# Patient Record
Sex: Female | Born: 1965 | Race: White | Hispanic: No | Marital: Single | Smoking: Current every day smoker
Health system: Southern US, Community
[De-identification: ages and names within clinical notes are randomized; demographics above are authoritative.]

## PROBLEM LIST (undated history)

## (undated) DIAGNOSIS — C50919 Malignant neoplasm of unspecified site of unspecified female breast: Secondary | ICD-10-CM

## (undated) DIAGNOSIS — Z923 Personal history of irradiation: Secondary | ICD-10-CM

## (undated) DIAGNOSIS — W540XXA Bitten by dog, initial encounter: Secondary | ICD-10-CM

## (undated) DIAGNOSIS — S0185XA Open bite of other part of head, initial encounter: Secondary | ICD-10-CM

## (undated) DIAGNOSIS — Z87891 Personal history of nicotine dependence: Secondary | ICD-10-CM

## (undated) DIAGNOSIS — Z9221 Personal history of antineoplastic chemotherapy: Secondary | ICD-10-CM

## (undated) DIAGNOSIS — E669 Obesity, unspecified: Secondary | ICD-10-CM

## (undated) HISTORY — DX: Obesity, unspecified: E66.9

## (undated) HISTORY — DX: Bitten by dog, initial encounter: W54.0XXA

## (undated) HISTORY — DX: Personal history of nicotine dependence: Z87.891

## (undated) HISTORY — DX: Open bite of other part of head, initial encounter: S01.85XA

---

## 2009-08-24 DIAGNOSIS — Z923 Personal history of irradiation: Secondary | ICD-10-CM

## 2009-08-24 DIAGNOSIS — Z9221 Personal history of antineoplastic chemotherapy: Secondary | ICD-10-CM

## 2009-08-24 DIAGNOSIS — C50919 Malignant neoplasm of unspecified site of unspecified female breast: Secondary | ICD-10-CM

## 2009-08-24 HISTORY — PX: PORTACATH PLACEMENT: SHX2246

## 2009-08-24 HISTORY — PX: BREAST LUMPECTOMY: SHX2

## 2009-08-24 HISTORY — DX: Malignant neoplasm of unspecified site of unspecified female breast: C50.919

## 2009-08-24 HISTORY — PX: BREAST SURGERY: SHX581

## 2009-08-24 HISTORY — DX: Personal history of antineoplastic chemotherapy: Z92.21

## 2009-08-24 HISTORY — PX: BREAST EXCISIONAL BIOPSY: SUR124

## 2009-08-24 HISTORY — DX: Personal history of irradiation: Z92.3

## 2009-09-24 ENCOUNTER — Ambulatory Visit: Payer: Self-pay | Admitting: Oncology

## 2009-09-24 ENCOUNTER — Ambulatory Visit: Payer: Self-pay

## 2009-10-14 ENCOUNTER — Ambulatory Visit: Payer: Self-pay | Admitting: Oncology

## 2009-10-17 ENCOUNTER — Ambulatory Visit: Payer: Self-pay | Admitting: Oncology

## 2009-10-18 ENCOUNTER — Ambulatory Visit: Payer: Self-pay | Admitting: General Surgery

## 2009-10-22 ENCOUNTER — Ambulatory Visit: Payer: Self-pay | Admitting: Oncology

## 2009-10-29 ENCOUNTER — Emergency Department: Payer: Self-pay | Admitting: Emergency Medicine

## 2009-11-22 ENCOUNTER — Ambulatory Visit: Payer: Self-pay | Admitting: Oncology

## 2009-12-22 ENCOUNTER — Ambulatory Visit: Payer: Self-pay | Admitting: Oncology

## 2010-01-22 ENCOUNTER — Ambulatory Visit: Payer: Self-pay | Admitting: Oncology

## 2010-02-21 ENCOUNTER — Ambulatory Visit: Payer: Self-pay | Admitting: Oncology

## 2010-03-24 ENCOUNTER — Ambulatory Visit: Payer: Self-pay | Admitting: Oncology

## 2010-04-11 ENCOUNTER — Ambulatory Visit: Payer: Self-pay | Admitting: General Surgery

## 2010-04-16 ENCOUNTER — Ambulatory Visit: Payer: Self-pay | Admitting: General Surgery

## 2010-04-22 LAB — PATHOLOGY REPORT

## 2010-04-24 ENCOUNTER — Ambulatory Visit: Payer: Self-pay | Admitting: Oncology

## 2010-05-12 ENCOUNTER — Ambulatory Visit: Payer: Self-pay | Admitting: General Surgery

## 2010-05-14 LAB — PATHOLOGY REPORT

## 2010-05-24 ENCOUNTER — Ambulatory Visit: Payer: Self-pay | Admitting: Oncology

## 2010-05-29 ENCOUNTER — Ambulatory Visit: Payer: Self-pay | Admitting: Oncology

## 2010-06-24 ENCOUNTER — Ambulatory Visit: Payer: Self-pay | Admitting: Oncology

## 2010-07-24 ENCOUNTER — Ambulatory Visit: Payer: Self-pay | Admitting: Oncology

## 2010-08-24 ENCOUNTER — Ambulatory Visit: Payer: Self-pay | Admitting: Oncology

## 2010-08-27 ENCOUNTER — Ambulatory Visit: Payer: Self-pay

## 2010-09-24 ENCOUNTER — Ambulatory Visit: Payer: Self-pay | Admitting: Oncology

## 2010-10-30 ENCOUNTER — Ambulatory Visit: Payer: Self-pay | Admitting: Oncology

## 2010-11-23 ENCOUNTER — Ambulatory Visit: Payer: Self-pay | Admitting: Oncology

## 2011-01-30 ENCOUNTER — Ambulatory Visit: Payer: Self-pay | Admitting: Oncology

## 2011-01-31 LAB — CANCER ANTIGEN 27.29: CA 27.29: 24.4 U/mL (ref 0.0–38.6)

## 2011-02-22 ENCOUNTER — Ambulatory Visit: Payer: Self-pay | Admitting: Oncology

## 2011-03-24 ENCOUNTER — Ambulatory Visit: Payer: Self-pay

## 2011-04-21 ENCOUNTER — Ambulatory Visit: Payer: Self-pay | Admitting: Oncology

## 2011-05-12 ENCOUNTER — Ambulatory Visit: Payer: Self-pay | Admitting: Oncology

## 2011-05-25 ENCOUNTER — Ambulatory Visit: Payer: Self-pay | Admitting: Oncology

## 2011-06-03 ENCOUNTER — Ambulatory Visit: Payer: Self-pay | Admitting: Gynecologic Oncology

## 2011-06-09 ENCOUNTER — Ambulatory Visit: Payer: Self-pay | Admitting: Gynecologic Oncology

## 2011-06-25 ENCOUNTER — Ambulatory Visit: Payer: Self-pay | Admitting: Oncology

## 2011-08-11 ENCOUNTER — Ambulatory Visit: Payer: Self-pay | Admitting: Oncology

## 2011-08-25 ENCOUNTER — Ambulatory Visit: Payer: Self-pay | Admitting: Oncology

## 2011-08-25 DIAGNOSIS — E669 Obesity, unspecified: Secondary | ICD-10-CM

## 2011-08-25 DIAGNOSIS — Z87891 Personal history of nicotine dependence: Secondary | ICD-10-CM

## 2011-08-25 HISTORY — DX: Personal history of nicotine dependence: Z87.891

## 2011-08-25 HISTORY — DX: Obesity, unspecified: E66.9

## 2011-10-01 IMAGING — US ULTRASOUND LEFT BREAST
1 series · 17 of 17 positions shown · non-contrast
Comparison: none

REASON FOR EXAM: 1-2 cm thickening at [DATE] left breast 4 cm from the
[place]
COMMENTS:

[Series 1: ultrasound left breast · 17 of 17 slices shown]
[im 1/17]
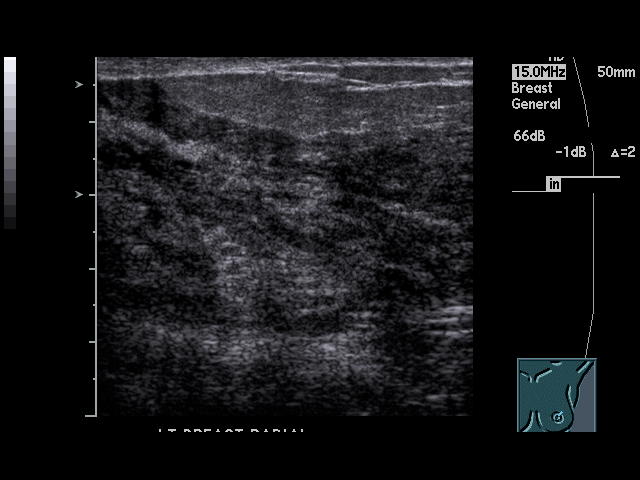
[im 2/17]
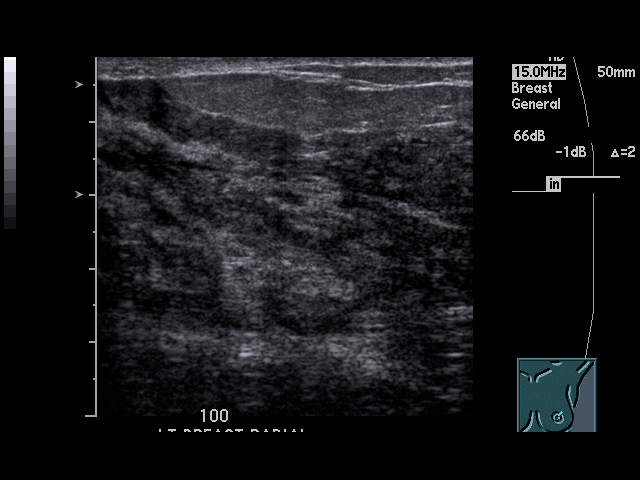
[im 3/17]
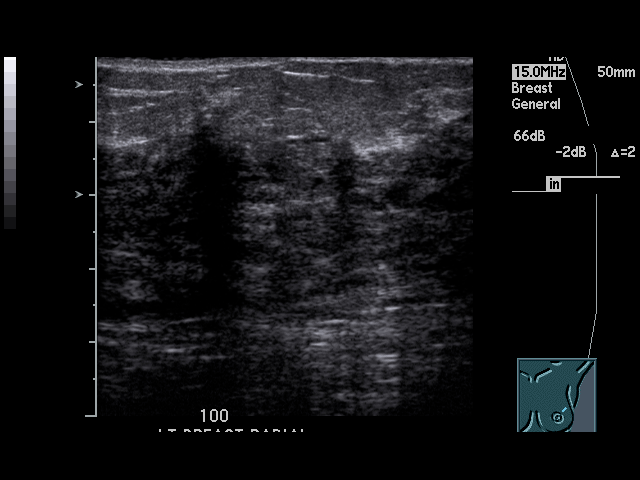
[im 4/17]
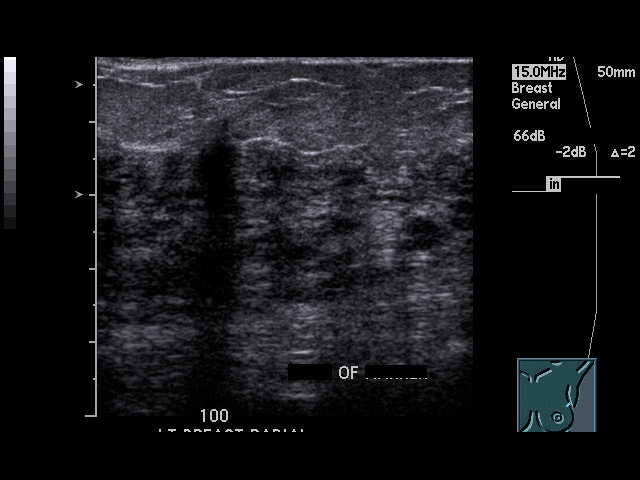
[im 5/17]
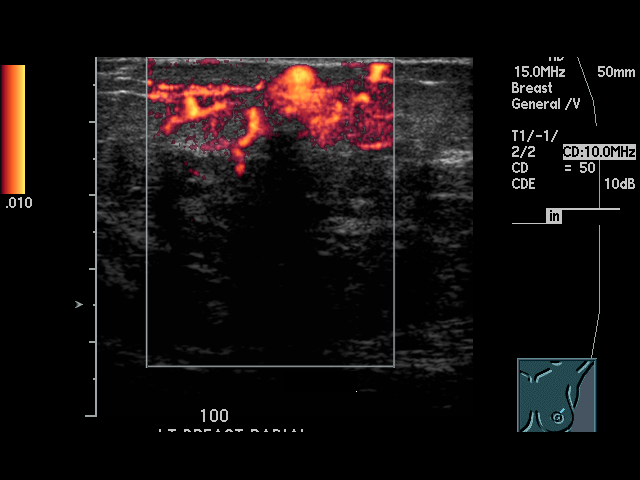
[im 6/17]
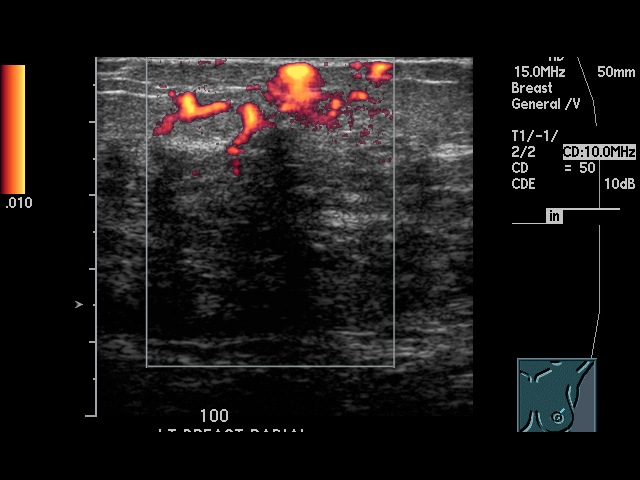
[im 7/17]
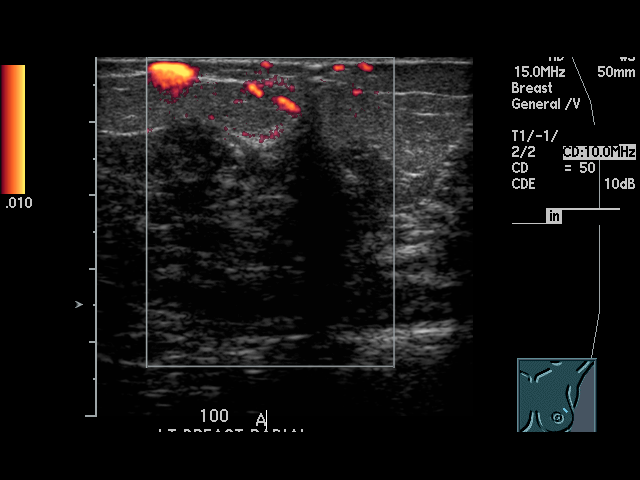
[im 8/17]
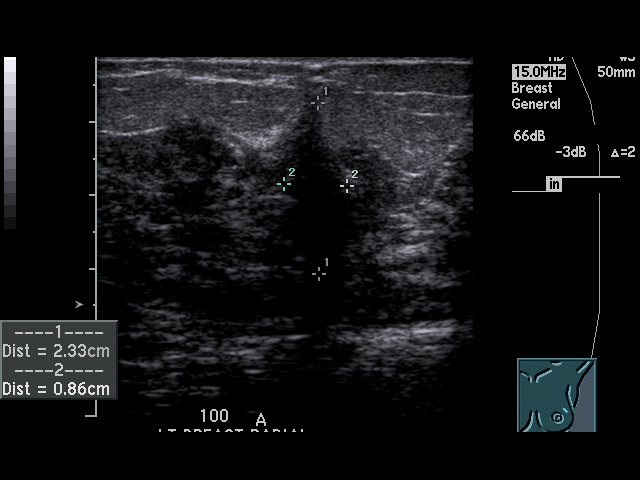
[im 9/17]
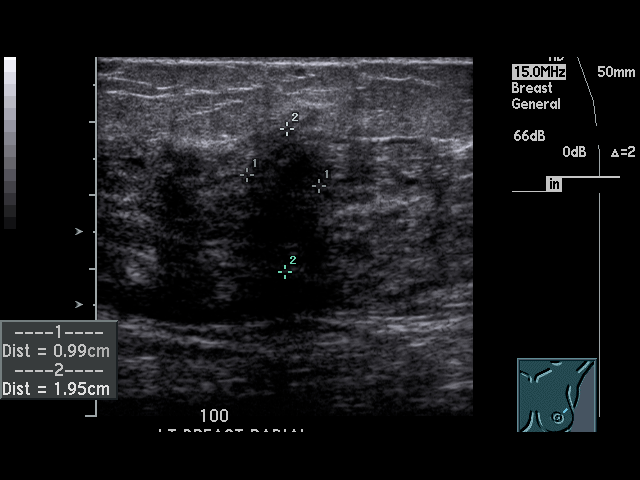
[im 10/17]
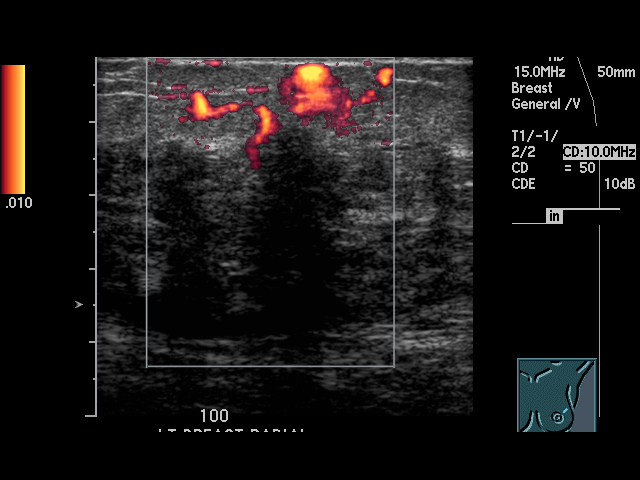
[im 11/17]
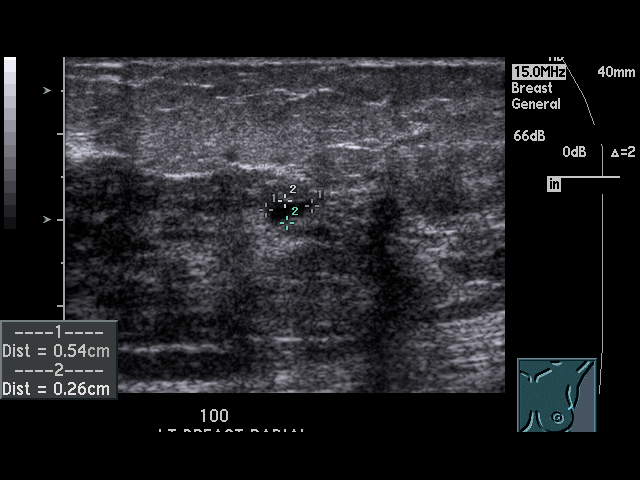
[im 12/17]
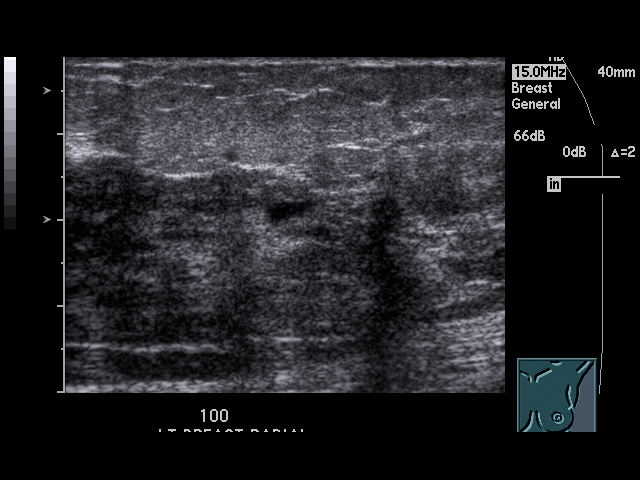
[im 13/17]
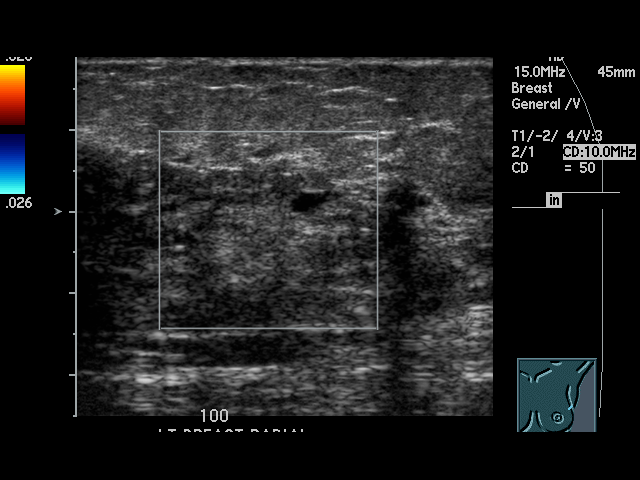
[im 14/17]
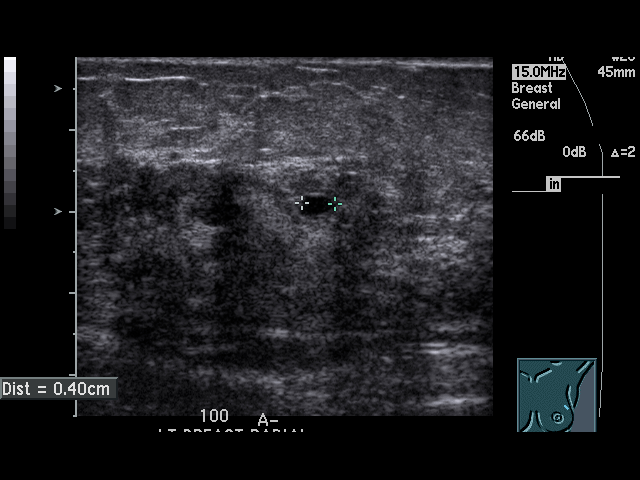
[im 15/17]
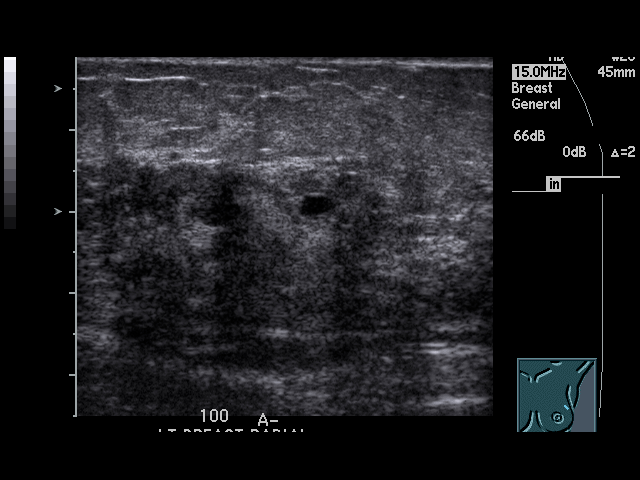
[im 16/17]
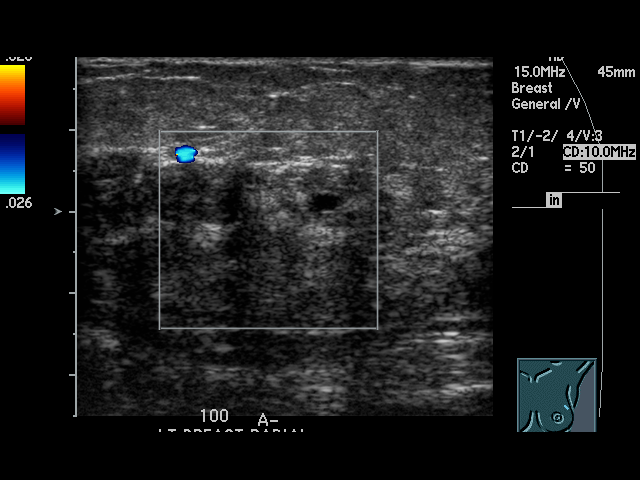
[im 17/17]
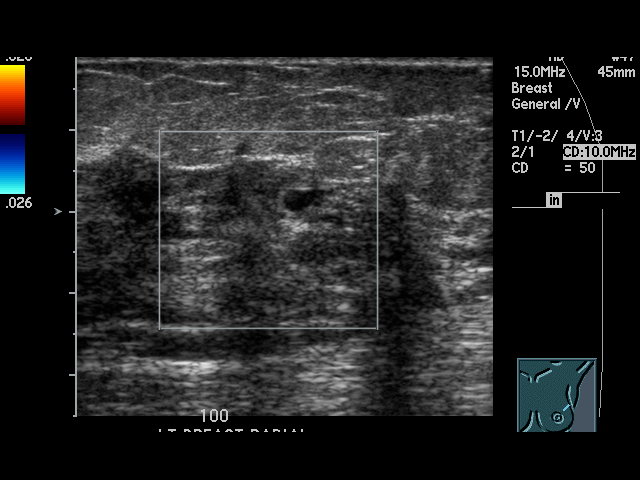

[17 of 17 positions shown; findings below may reference images not displayed]

PROCEDURE:     US  - US LT BREAST ([REDACTED])  - September 24, 2009 [DATE]

RESULT:       A large 2.3 cm hypoechoic irregular lesion with shadowing is
noted in the region of the patient's palpable abnormality.  Mammographic
abnormality in this region reveals pathologic-appearing microcalcifications
and a soft tissue density.  The findings are suspicious for malignancy and
surgical evaluation is suggested.  Small cyst is also incidentally noted in
the left breast.  This measures 5 mm.
IMPRESSION: 1.     Very suspicious exam for malignancy. Surgical consultation is
suggested.
2.     BI-RADS:  Category 4- Suspicious Abnormality.

## 2011-11-10 ENCOUNTER — Ambulatory Visit: Payer: Self-pay | Admitting: Oncology

## 2011-11-23 ENCOUNTER — Ambulatory Visit: Payer: Self-pay | Admitting: Oncology

## 2011-12-10 LAB — COMPREHENSIVE METABOLIC PANEL
Alkaline Phosphatase: 60 U/L (ref 50–136)
Anion Gap: 9 (ref 7–16)
BUN: 8 mg/dL (ref 7–18)
Bilirubin,Total: 0.7 mg/dL (ref 0.2–1.0)
Calcium, Total: 8.7 mg/dL (ref 8.5–10.1)
Chloride: 105 mmol/L (ref 98–107)
Co2: 27 mmol/L (ref 21–32)
Creatinine: 0.78 mg/dL (ref 0.60–1.30)
EGFR (African American): >60
EGFR (Non-African Amer.): >60
Glucose: 131 mg/dL — ABNORMAL HIGH (ref 65–99)
Osmolality: 281 (ref 275–301)
Potassium: 3.6 mmol/L (ref 3.5–5.1)
SGOT(AST): 25 U/L (ref 15–37)
SGPT (ALT): 34 U/L
Total Protein: 6.7 g/dL (ref 6.4–8.2)

## 2011-12-10 LAB — CBC CANCER CENTER
Eosinophil #: 0.1 x10 3/mm (ref 0.0–0.7)
Eosinophil %: 2.5 %
HCT: 35.1 % (ref 35.0–47.0)
HGB: 12.3 g/dL (ref 12.0–16.0)
Lymphocyte #: 1 x10 3/mm (ref 1.0–3.6)
Lymphocyte %: 23.8 %
MCHC: 35 g/dL (ref 32.0–36.0)
MCV: 97 fL (ref 80–100)
Monocyte #: 0.2 x10 3/mm (ref 0.2–0.9)
Monocyte %: 5.2 %
Platelet: 211 x10 3/mm (ref 150–440)
WBC: 4.3 x10 3/mm (ref 3.6–11.0)

## 2011-12-23 ENCOUNTER — Ambulatory Visit: Payer: Self-pay | Admitting: Oncology

## 2012-01-07 LAB — COMPREHENSIVE METABOLIC PANEL
Albumin: 3.8 g/dL (ref 3.4–5.0)
BUN: 6 mg/dL — ABNORMAL LOW (ref 7–18)
Bilirubin,Total: 0.8 mg/dL (ref 0.2–1.0)
Chloride: 105 mmol/L (ref 98–107)
Co2: 26 mmol/L (ref 21–32)
Creatinine: 0.63 mg/dL (ref 0.60–1.30)
EGFR (African American): >60
EGFR (Non-African Amer.): >60
Glucose: 87 mg/dL (ref 65–99)
SGOT(AST): 27 U/L (ref 15–37)
Sodium: 141 mmol/L (ref 136–145)
Total Protein: 6.9 g/dL (ref 6.4–8.2)

## 2012-01-07 LAB — CBC CANCER CENTER
Basophil #: 0 x10 3/mm (ref 0.0–0.1)
Basophil %: 0.6 %
Eosinophil #: 0.1 x10 3/mm (ref 0.0–0.7)
Eosinophil %: 2.4 %
HCT: 37.2 % (ref 35.0–47.0)
Lymphocyte #: 1.2 x10 3/mm (ref 1.0–3.6)
MCH: 33.3 pg (ref 26.0–34.0)
MCHC: 34.5 g/dL (ref 32.0–36.0)
MCV: 96 fL (ref 80–100)
Monocyte #: 0.2 x10 3/mm (ref 0.2–0.9)
Neutrophil #: 3.4 x10 3/mm (ref 1.4–6.5)
RBC: 3.86 10*6/uL (ref 3.80–5.20)

## 2012-01-23 ENCOUNTER — Ambulatory Visit: Payer: Self-pay | Admitting: Oncology

## 2012-03-15 ENCOUNTER — Ambulatory Visit: Payer: Self-pay | Admitting: Oncology

## 2012-03-24 ENCOUNTER — Ambulatory Visit: Payer: Self-pay | Admitting: Oncology

## 2012-04-07 LAB — CBC CANCER CENTER
Basophil #: 0 x10 3/mm (ref 0.0–0.1)
Basophil %: 0.9 %
Eosinophil %: 2.5 %
Lymphocyte #: 1.1 x10 3/mm (ref 1.0–3.6)
MCH: 33.8 pg (ref 26.0–34.0)
MCHC: 34.7 g/dL (ref 32.0–36.0)
Monocyte %: 5.7 %
Platelet: 178 x10 3/mm (ref 150–440)
RBC: 3.72 10*6/uL — ABNORMAL LOW (ref 3.80–5.20)
RDW: 12.9 % (ref 11.5–14.5)
WBC: 4.2 x10 3/mm (ref 3.6–11.0)

## 2012-04-07 LAB — COMPREHENSIVE METABOLIC PANEL
Anion Gap: 7 (ref 7–16)
BUN: 8 mg/dL (ref 7–18)
Bilirubin,Total: 0.7 mg/dL (ref 0.2–1.0)
Calcium, Total: 8.6 mg/dL (ref 8.5–10.1)
Chloride: 105 mmol/L (ref 98–107)
Creatinine: 0.77 mg/dL (ref 0.60–1.30)
EGFR (African American): >60
Osmolality: 276 (ref 275–301)
Potassium: 3.9 mmol/L (ref 3.5–5.1)
Total Protein: 7 g/dL (ref 6.4–8.2)

## 2012-04-08 LAB — CANCER ANTIGEN 27.29: CA 27.29: 18.2 U/mL (ref 0.0–38.6)

## 2012-04-12 ENCOUNTER — Ambulatory Visit: Payer: Self-pay | Admitting: Gynecologic Oncology

## 2012-04-12 LAB — CBC
HGB: 13.3 g/dL (ref 12.0–16.0)
MCH: 34.3 pg — ABNORMAL HIGH (ref 26.0–34.0)
MCHC: 35.3 g/dL (ref 32.0–36.0)
Platelet: 208 10*3/uL (ref 150–440)
RDW: 13 % (ref 11.5–14.5)
WBC: 5.3 10*3/uL (ref 3.6–11.0)

## 2012-04-12 LAB — BASIC METABOLIC PANEL
BUN: 8 mg/dL (ref 7–18)
Calcium, Total: 9 mg/dL (ref 8.5–10.1)
Creatinine: 0.68 mg/dL (ref 0.60–1.30)
EGFR (African American): >60
EGFR (Non-African Amer.): >60
Glucose: 76 mg/dL (ref 65–99)
Potassium: 3.8 mmol/L (ref 3.5–5.1)
Sodium: 138 mmol/L (ref 136–145)

## 2012-04-19 ENCOUNTER — Ambulatory Visit: Payer: Self-pay | Admitting: Gynecologic Oncology

## 2012-04-24 ENCOUNTER — Ambulatory Visit: Payer: Self-pay | Admitting: Oncology

## 2012-05-24 ENCOUNTER — Ambulatory Visit: Payer: Self-pay | Admitting: Oncology

## 2012-06-05 IMAGING — CT CT SIM MISC
1 series · 16 of 33 positions shown, 20 images · non-contrast
Comparison: none

[Series 2: — · axial · 1.17mm/px · z∈[-855,-576]mm · 16 of 101 slices shown, 20 images]
[im 4/101  mediastinal]
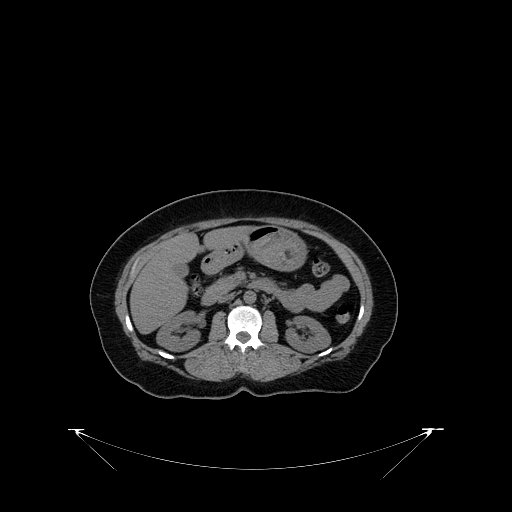
[im 4/101  lung]
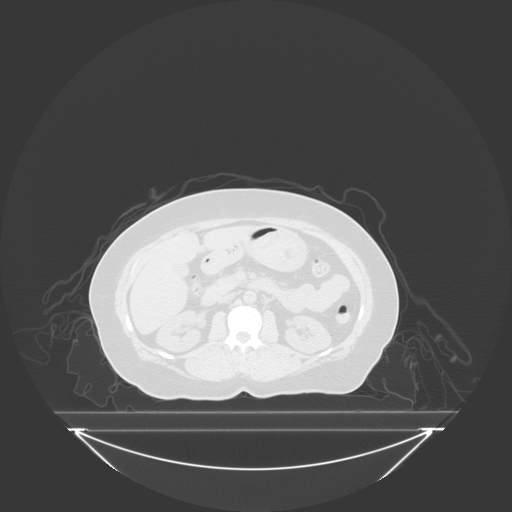
[im 12/101  lung]
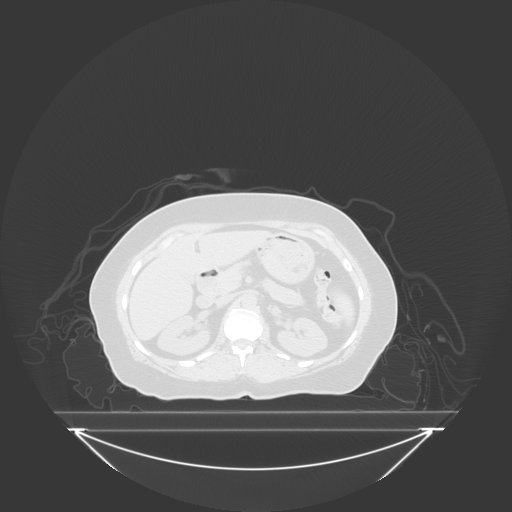
[im 19/101  lung]
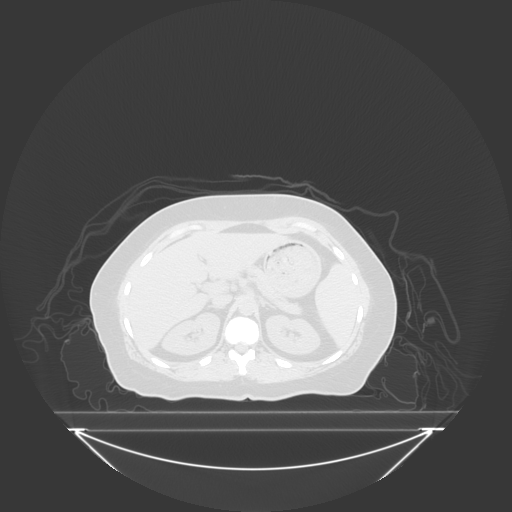
[im 23/101  lung]
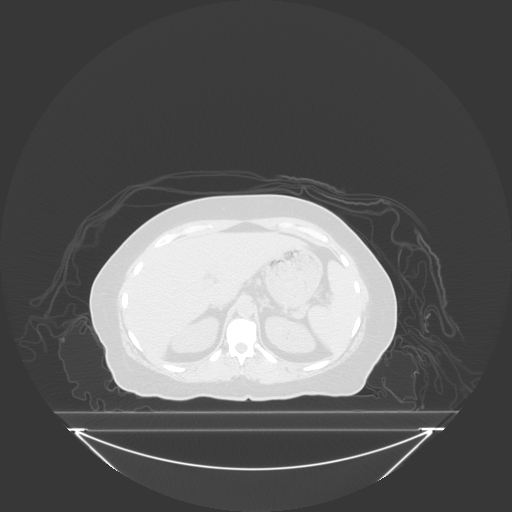
[im 30/101  mediastinal]
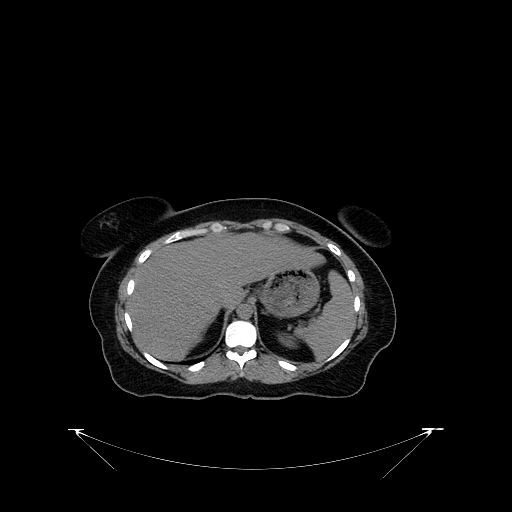
[im 30/101  lung]
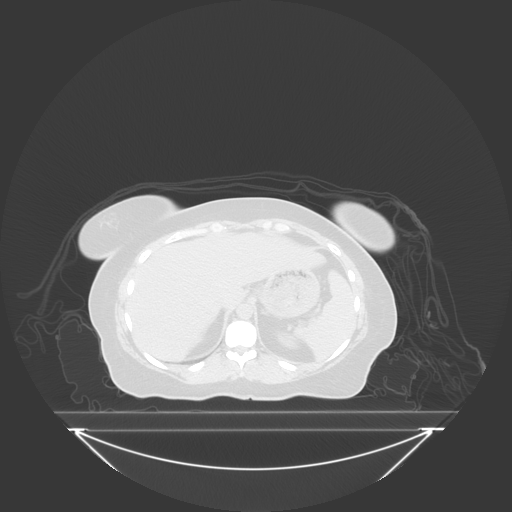
[im 38/101  lung]
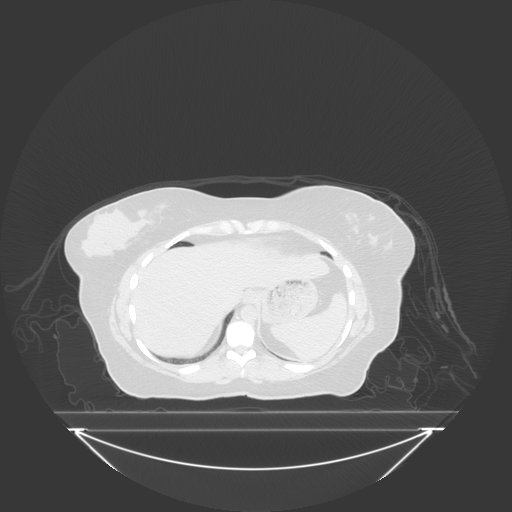
[im 45/101  lung]
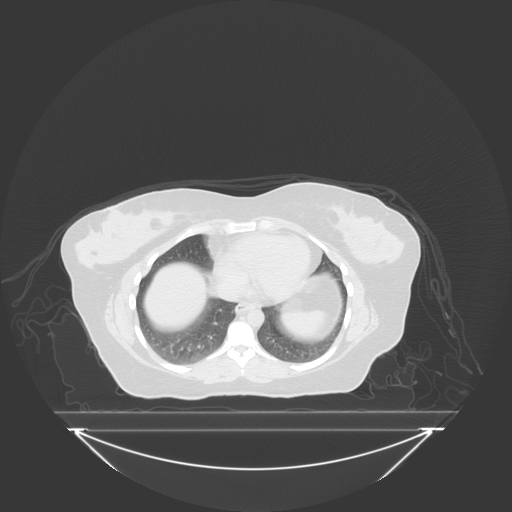
[im 49/101  lung]
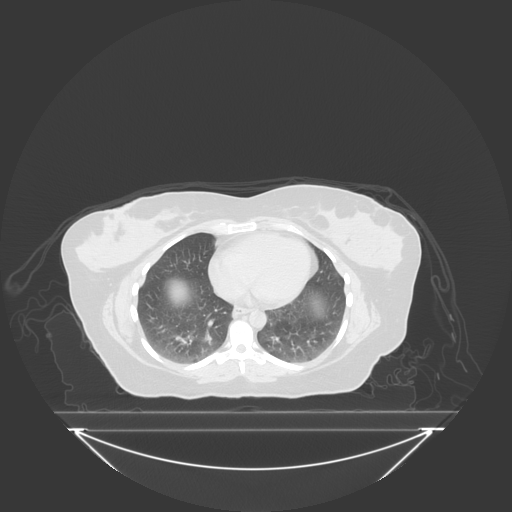
[im 54/101  mediastinal]
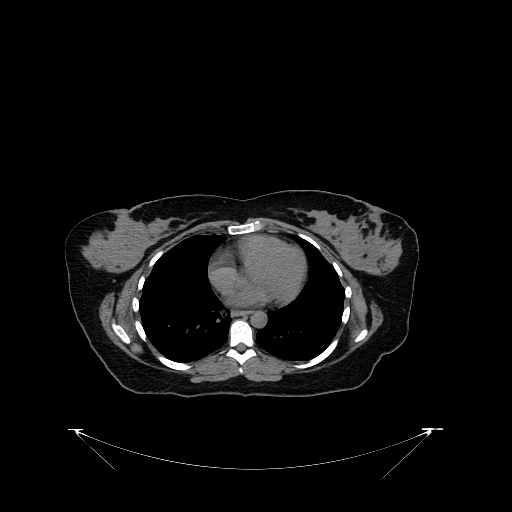
[im 54/101  lung]
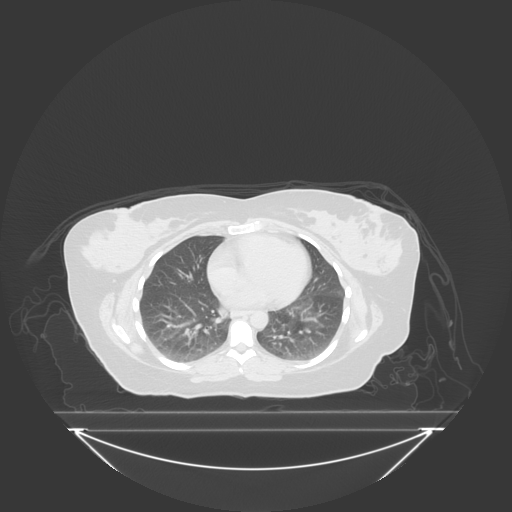
[im 60/101  lung]
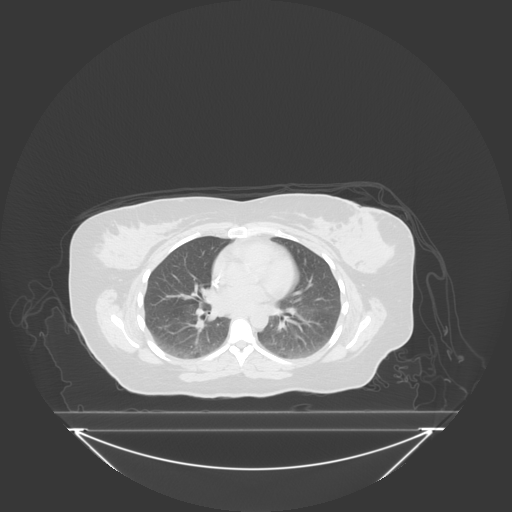
[im 63/101  lung]
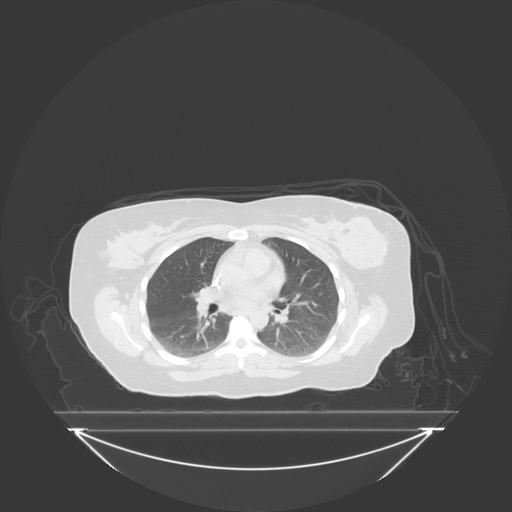
[im 71/101  lung]
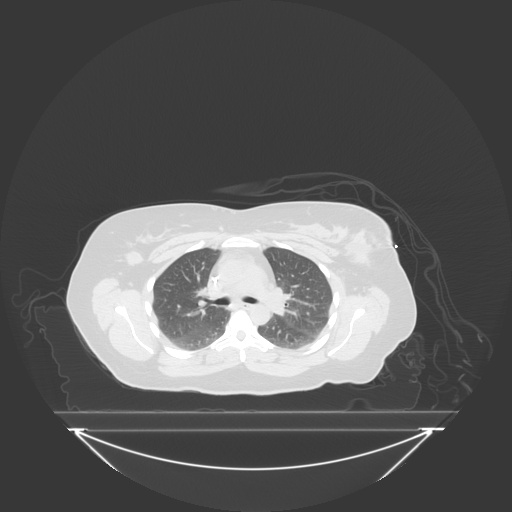
[im 78/101  mediastinal]
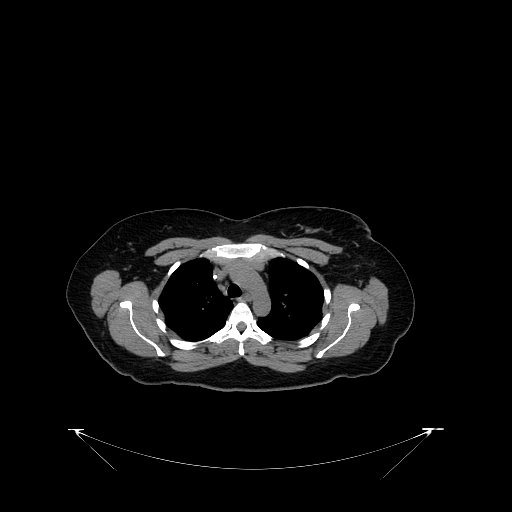
[im 78/101  lung]
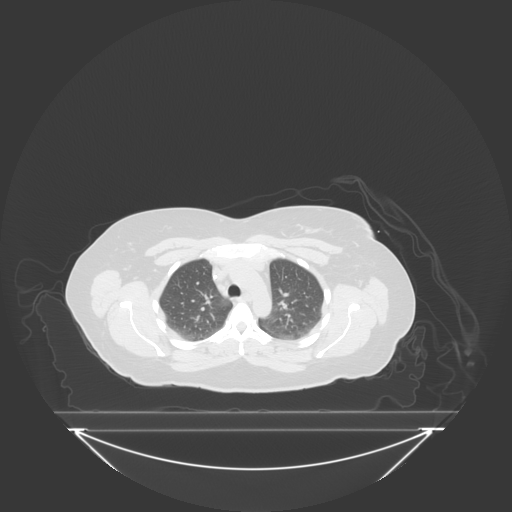
[im 82/101  lung]
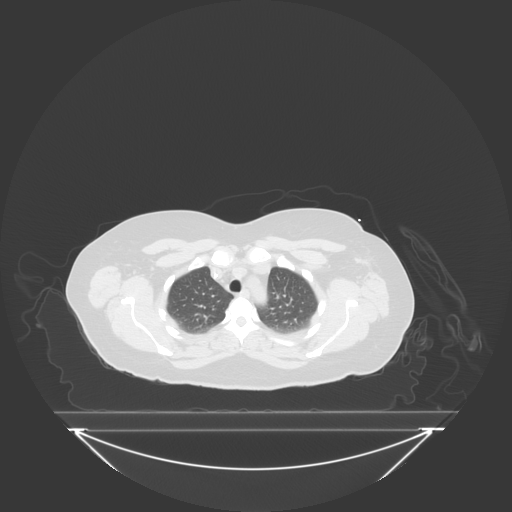
[im 89/101  lung]
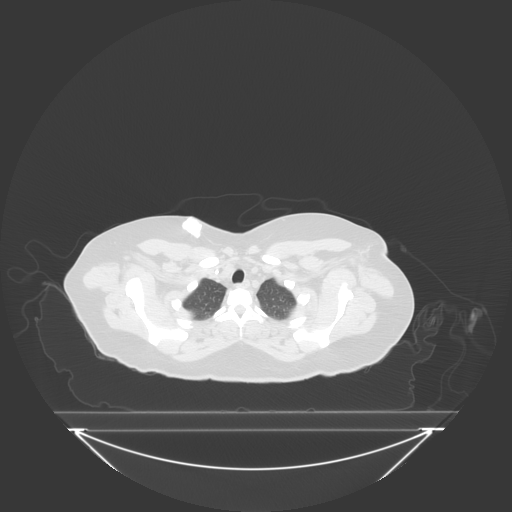
[im 97/101  lung]
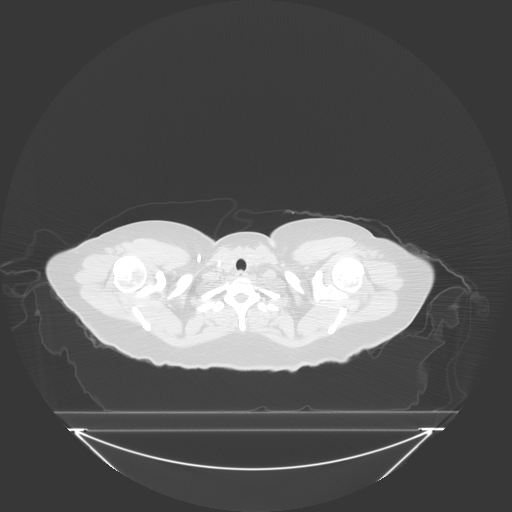

[16 of 33 positions shown; findings below may reference images not displayed]

IMAGES IMPORTED FROM THE [employer] WORKFLOW SYSTEM
NO DICTATION FOR STUDY

## 2012-08-30 ENCOUNTER — Ambulatory Visit: Payer: Self-pay | Admitting: Oncology

## 2012-09-24 ENCOUNTER — Ambulatory Visit: Payer: Self-pay | Admitting: Oncology

## 2012-10-22 ENCOUNTER — Ambulatory Visit: Payer: Self-pay | Admitting: Oncology

## 2012-11-10 LAB — CBC CANCER CENTER
Basophil %: 1.1 %
Eosinophil #: 0.1 x10 3/mm (ref 0.0–0.7)
Eosinophil %: 2.2 %
HCT: 36.9 % (ref 35.0–47.0)
HGB: 13.2 g/dL (ref 12.0–16.0)
MCH: 33.8 pg (ref 26.0–34.0)
MCHC: 35.8 g/dL (ref 32.0–36.0)
MCV: 94 fL (ref 80–100)
Monocyte %: 5.2 %
Neutrophil %: 63.7 %
Platelet: 209 x10 3/mm (ref 150–440)
RBC: 3.91 10*6/uL (ref 3.80–5.20)
WBC: 5.2 x10 3/mm (ref 3.6–11.0)

## 2012-11-10 LAB — COMPREHENSIVE METABOLIC PANEL
Albumin: 3.6 g/dL (ref 3.4–5.0)
Anion Gap: 4 — ABNORMAL LOW (ref 7–16)
Bilirubin,Total: 0.8 mg/dL (ref 0.2–1.0)
Calcium, Total: 8.6 mg/dL (ref 8.5–10.1)
Co2: 27 mmol/L (ref 21–32)
Creatinine: 0.65 mg/dL (ref 0.60–1.30)
EGFR (African American): >60
EGFR (Non-African Amer.): >60
Osmolality: 269 (ref 275–301)
SGOT(AST): 55 U/L — ABNORMAL HIGH (ref 15–37)
SGPT (ALT): 56 U/L (ref 12–78)
Total Protein: 7.6 g/dL (ref 6.4–8.2)

## 2012-11-11 LAB — CANCER ANTIGEN 27.29: CA 27.29: 28.2 U/mL (ref 0.0–38.6)

## 2012-11-22 ENCOUNTER — Ambulatory Visit: Payer: Self-pay | Admitting: Oncology

## 2013-01-18 ENCOUNTER — Encounter: Payer: Self-pay | Admitting: *Deleted

## 2013-01-18 DIAGNOSIS — C50412 Malignant neoplasm of upper-outer quadrant of left female breast: Secondary | ICD-10-CM | POA: Insufficient documentation

## 2013-02-23 ENCOUNTER — Ambulatory Visit: Payer: Self-pay | Admitting: Gynecologic Oncology

## 2013-02-27 ENCOUNTER — Ambulatory Visit: Payer: Self-pay | Admitting: Oncology

## 2013-03-24 ENCOUNTER — Ambulatory Visit: Payer: Self-pay | Admitting: Oncology

## 2013-03-29 ENCOUNTER — Ambulatory Visit: Payer: Self-pay

## 2013-05-02 ENCOUNTER — Encounter: Payer: Self-pay | Admitting: General Surgery

## 2013-05-02 ENCOUNTER — Ambulatory Visit (INDEPENDENT_AMBULATORY_CARE_PROVIDER_SITE_OTHER): Payer: PRIVATE HEALTH INSURANCE | Admitting: General Surgery

## 2013-05-02 VITALS — BP 132/98 | HR 70 | Resp 12 | Ht 62.0 in | Wt 163.0 lb

## 2013-05-02 DIAGNOSIS — Z853 Personal history of malignant neoplasm of breast: Secondary | ICD-10-CM

## 2013-05-02 NOTE — Progress Notes (Signed)
Patient ID: Sonya Perry, female   DOB: November 21, 1965, 47 y.o.   MRN: [id number]  Chief Complaint  Patient presents with  . Follow-up    mammogram    HPI Sonya Perry is a 47 y.o. female who presents for a breast cancer follow up evaluation. The most recent mammogram was done on 03/29/13.  Patient does perform regular self breast checks and gets regular mammograms done.    HPI  Past Medical History  Diagnosis Date  . Cancer 2011    left breast  . Personal history of tobacco use, presenting hazards to health 2013  . Obesity, unspecified 2013  . Malignant neoplasm of upper-outer quadrant of female breast 2011    left breast    Past Surgical History  Procedure Laterality Date  . Cesarean section  1989  . Portacath placement  2011  . Breast surgery Left 2011    lumpectomy radiation and chemo.    History reviewed. No pertinent family history.  Social History History  Substance Use Topics  . Smoking status: Former Smoker -- 2 years    Types: Cigarettes  . Smokeless tobacco: Never Used     Comment: 1-2 cigarettes a day  . Alcohol Use: No    Allergies  Allergen Reactions  . Aleve [Naproxen Sodium] Other (See Comments)    Stomach pain/gas pains/upset stomach    Current Outpatient Prescriptions  Medication Sig Dispense Refill  . tamoxifen (NOLVADEX) 20 MG tablet Take 20 mg by mouth daily.       No current facility-administered medications for this visit.    Review of Systems Review of Systems  Constitutional: Negative.   Respiratory: Negative.   Cardiovascular: Negative.     Blood pressure 132/98, pulse 70, resp. rate 12, height 5\' 2"  (1.575 m), weight 163 lb (73.936 kg).  Physical Exam Physical Exam  Constitutional: She is oriented to person, place, and time. She appears well-developed and well-nourished.  Eyes: Conjunctivae are normal. No scleral icterus.  Neck: Neck supple. No tracheal deviation present. No mass and no thyromegaly present.   Cardiovascular: Normal rate, regular rhythm, normal heart sounds, intact distal pulses and normal pulses.   Pulses:      Dorsalis pedis pulses are 2+ on the right side, and 2+ on the left side.       Posterior tibial pulses are 2+ on the right side, and 2+ on the left side.  No edema  Or calf tenderness.  Pulmonary/Chest: Breath sounds normal. Right breast exhibits no inverted nipple, no mass, no nipple discharge, no skin change and no tenderness. Left breast exhibits no inverted nipple, no mass, no nipple discharge, no skin change and no tenderness.  Left lumpectomy site well healed.  Lymphadenopathy:    She has no cervical adenopathy.    She has no axillary adenopathy.  Neurological: She is alert and oriented to person, place, and time.  Skin: Skin is warm and dry.    Data Reviewed Mammogram reviewed -BIRADS 2  Assessment   Stable exam  Patient is three years post  left breast cancer (T1,N0,M0, ER/PR pos, Her2 neg)  treatment, now  on tamoxifen.  Plan    Patient to return in one year with bilateral diagnotic mammogram.      Juneau Doughman G 05/02/2013, 9:17 AM

## 2013-05-02 NOTE — Patient Instructions (Addendum)
Patient will be asked to return to the office in one year.

## 2013-05-11 ENCOUNTER — Ambulatory Visit: Payer: Self-pay | Admitting: Oncology

## 2013-05-11 LAB — COMPREHENSIVE METABOLIC PANEL
Albumin: 3.8 g/dL (ref 3.4–5.0)
Anion Gap: 8 (ref 7–16)
BUN: 7 mg/dL (ref 7–18)
Bilirubin,Total: 0.8 mg/dL (ref 0.2–1.0)
Calcium, Total: 9.2 mg/dL (ref 8.5–10.1)
Creatinine: 0.78 mg/dL (ref 0.60–1.30)
EGFR (Non-African Amer.): >60
Glucose: 135 mg/dL — ABNORMAL HIGH (ref 65–99)
Osmolality: 278 (ref 275–301)
Potassium: 4.1 mmol/L (ref 3.5–5.1)
SGOT(AST): 75 U/L — ABNORMAL HIGH (ref 15–37)
Total Protein: 6.9 g/dL (ref 6.4–8.2)

## 2013-05-11 LAB — CBC CANCER CENTER
Eosinophil #: 0.1 x10 3/mm (ref 0.0–0.7)
HCT: 39 % (ref 35.0–47.0)
HGB: 13.8 g/dL (ref 12.0–16.0)
Lymphocyte %: 29.4 %
MCHC: 35.3 g/dL (ref 32.0–36.0)
Monocyte %: 4.7 %
Neutrophil #: 3.2 x10 3/mm (ref 1.4–6.5)
Neutrophil %: 62.3 %
Platelet: 221 x10 3/mm (ref 150–440)
RBC: 4.08 10*6/uL (ref 3.80–5.20)
RDW: 12.9 % (ref 11.5–14.5)

## 2013-05-12 LAB — CANCER ANTIGEN 27.29: CA 27.29: 17.4 U/mL (ref 0.0–38.6)

## 2013-05-24 ENCOUNTER — Ambulatory Visit: Payer: Self-pay | Admitting: Oncology

## 2013-06-22 LAB — HEPATIC FUNCTION PANEL A (ARMC)
Albumin: 3.6 g/dL (ref 3.4–5.0)
Alkaline Phosphatase: 84 U/L (ref 50–136)
Bilirubin,Total: 0.8 mg/dL (ref 0.2–1.0)
SGOT(AST): 74 U/L — ABNORMAL HIGH (ref 15–37)

## 2013-06-24 ENCOUNTER — Ambulatory Visit: Payer: Self-pay | Admitting: Oncology

## 2013-08-10 ENCOUNTER — Ambulatory Visit: Payer: Self-pay | Admitting: Oncology

## 2013-08-10 LAB — COMPREHENSIVE METABOLIC PANEL
Albumin: 3.7 g/dL (ref 3.4–5.0)
Alkaline Phosphatase: 82 U/L
BUN: 8 mg/dL (ref 7–18)
Calcium, Total: 8.9 mg/dL (ref 8.5–10.1)
Chloride: 103 mmol/L (ref 98–107)
Creatinine: 0.66 mg/dL (ref 0.60–1.30)
EGFR (African American): >60
EGFR (Non-African Amer.): >60
Osmolality: 275 (ref 275–301)
SGOT(AST): 68 U/L — ABNORMAL HIGH (ref 15–37)
SGPT (ALT): 93 U/L — ABNORMAL HIGH (ref 12–78)
Sodium: 137 mmol/L (ref 136–145)
Total Protein: 7 g/dL (ref 6.4–8.2)

## 2013-08-10 LAB — CBC CANCER CENTER
Basophil #: 0.1 x10 3/mm (ref 0.0–0.1)
Basophil %: 1.1 %
Eosinophil #: 0.3 x10 3/mm (ref 0.0–0.7)
HGB: 14 g/dL (ref 12.0–16.0)
Lymphocyte #: 1.4 x10 3/mm (ref 1.0–3.6)
MCHC: 34.3 g/dL (ref 32.0–36.0)
MCV: 95 fL (ref 80–100)
Monocyte #: 0.2 x10 3/mm (ref 0.2–0.9)
Neutrophil #: 3.3 x10 3/mm (ref 1.4–6.5)

## 2013-08-24 ENCOUNTER — Ambulatory Visit: Payer: Self-pay | Admitting: Oncology

## 2014-02-08 ENCOUNTER — Ambulatory Visit: Payer: Self-pay | Admitting: Oncology

## 2014-02-08 LAB — CBC CANCER CENTER
BASOS PCT: 0.9 %
Basophil #: 0.1 x10 3/mm (ref 0.0–0.1)
EOS ABS: 0.1 x10 3/mm (ref 0.0–0.7)
Eosinophil %: 2.4 %
HCT: 38.9 % (ref 35.0–47.0)
HGB: 13.2 g/dL (ref 12.0–16.0)
LYMPHS ABS: 1.6 x10 3/mm (ref 1.0–3.6)
LYMPHS PCT: 26.4 %
MCH: 32.4 pg (ref 26.0–34.0)
MCHC: 34 g/dL (ref 32.0–36.0)
MCV: 95 fL (ref 80–100)
Monocyte #: 0.3 x10 3/mm (ref 0.2–0.9)
Monocyte %: 4.7 %
Neutrophil #: 3.9 x10 3/mm (ref 1.4–6.5)
Neutrophil %: 65.6 %
PLATELETS: 219 x10 3/mm (ref 150–440)
RBC: 4.09 10*6/uL (ref 3.80–5.20)
RDW: 13 % (ref 11.5–14.5)
WBC: 5.9 x10 3/mm (ref 3.6–11.0)

## 2014-02-08 LAB — COMPREHENSIVE METABOLIC PANEL
ALK PHOS: 75 U/L
ALT: 53 U/L (ref 12–78)
Albumin: 3.5 g/dL (ref 3.4–5.0)
Anion Gap: 10 (ref 7–16)
BUN: 7 mg/dL (ref 7–18)
Bilirubin,Total: 0.6 mg/dL (ref 0.2–1.0)
CREATININE: 0.67 mg/dL (ref 0.60–1.30)
Calcium, Total: 9.4 mg/dL (ref 8.5–10.1)
Chloride: 103 mmol/L (ref 98–107)
Co2: 25 mmol/L (ref 21–32)
EGFR (African American): >60
EGFR (Non-African Amer.): >60
Glucose: 222 mg/dL — ABNORMAL HIGH (ref 65–99)
Osmolality: 281 (ref 275–301)
Potassium: 4.2 mmol/L (ref 3.5–5.1)
SGOT(AST): 37 U/L (ref 15–37)
Sodium: 138 mmol/L (ref 136–145)
TOTAL PROTEIN: 7 g/dL (ref 6.4–8.2)

## 2014-02-09 LAB — CANCER ANTIGEN 27.29: CA 27.29: 24.6 U/mL (ref 0.0–38.6)

## 2014-02-21 ENCOUNTER — Ambulatory Visit: Payer: Self-pay | Admitting: Oncology

## 2014-03-24 ENCOUNTER — Ambulatory Visit: Payer: Self-pay | Admitting: Oncology

## 2014-04-24 ENCOUNTER — Ambulatory Visit: Payer: Self-pay | Admitting: Oncology

## 2014-06-25 ENCOUNTER — Encounter: Payer: Self-pay | Admitting: General Surgery

## 2014-08-13 ENCOUNTER — Ambulatory Visit: Payer: Self-pay | Admitting: Oncology

## 2014-08-13 LAB — CBC CANCER CENTER
BASOS PCT: 1.4 %
Basophil #: 0.1 x10 3/mm (ref 0.0–0.1)
EOS ABS: 0.1 x10 3/mm (ref 0.0–0.7)
Eosinophil %: 2.4 %
HCT: 39.9 % (ref 35.0–47.0)
HGB: 14 g/dL (ref 12.0–16.0)
Lymphocyte #: 1.8 x10 3/mm (ref 1.0–3.6)
Lymphocyte %: 32.5 %
MCH: 32.3 pg (ref 26.0–34.0)
MCHC: 35.1 g/dL (ref 32.0–36.0)
MCV: 92 fL (ref 80–100)
Monocyte #: 0.2 x10 3/mm (ref 0.2–0.9)
Monocyte %: 4.2 %
Neutrophil #: 3.3 x10 3/mm (ref 1.4–6.5)
Neutrophil %: 59.5 %
PLATELETS: 223 x10 3/mm (ref 150–440)
RBC: 4.33 10*6/uL (ref 3.80–5.20)
RDW: 12.8 % (ref 11.5–14.5)
WBC: 5.5 x10 3/mm (ref 3.6–11.0)

## 2014-08-13 LAB — COMPREHENSIVE METABOLIC PANEL
ALK PHOS: 80 U/L
ALT: 54 U/L
ANION GAP: 10 (ref 7–16)
AST: 42 U/L — AB (ref 15–37)
Albumin: 3.8 g/dL (ref 3.4–5.0)
BUN: 7 mg/dL (ref 7–18)
Bilirubin,Total: 0.9 mg/dL (ref 0.2–1.0)
CHLORIDE: 101 mmol/L (ref 98–107)
CO2: 25 mmol/L (ref 21–32)
CREATININE: 0.64 mg/dL (ref 0.60–1.30)
Calcium, Total: 8.5 mg/dL (ref 8.5–10.1)
GLUCOSE: 133 mg/dL — AB (ref 65–99)
Osmolality: 272 (ref 275–301)
POTASSIUM: 4 mmol/L (ref 3.5–5.1)
SODIUM: 136 mmol/L (ref 136–145)
TOTAL PROTEIN: 7 g/dL (ref 6.4–8.2)

## 2014-08-14 LAB — CANCER ANTIGEN 27.29: CA 27.29: 20.6 U/mL (ref 0.0–38.6)

## 2014-08-20 ENCOUNTER — Ambulatory Visit: Payer: PRIVATE HEALTH INSURANCE | Admitting: General Surgery

## 2014-08-24 ENCOUNTER — Ambulatory Visit: Payer: Self-pay | Admitting: Oncology

## 2014-09-03 ENCOUNTER — Ambulatory Visit (INDEPENDENT_AMBULATORY_CARE_PROVIDER_SITE_OTHER): Payer: Medicaid Other | Admitting: General Surgery

## 2014-09-03 ENCOUNTER — Encounter: Payer: Self-pay | Admitting: General Surgery

## 2014-09-03 VITALS — BP 120/78 | HR 78 | Resp 12 | Ht 62.0 in | Wt 155.0 lb

## 2014-09-03 DIAGNOSIS — C50912 Malignant neoplasm of unspecified site of left female breast: Secondary | ICD-10-CM

## 2014-09-03 NOTE — Patient Instructions (Signed)
Patient to return in August 2016 with bilateral diagnostic mammogram. Continue self breast exams. Call office for any new breast issues or concerns.

## 2014-09-03 NOTE — Progress Notes (Signed)
Patient ID: Sonya Perry, female   DOB: Feb 23, 1966, 49 y.o.   MRN: [id number]  Chief Complaint  Patient presents with  . Follow-up    mammogram     HPI Sonya Perry is a 49 y.o. female who presents for a breast cancer follow up. The most recent mammogram was done on 04/03/14. Patient does not perform regular self breast checks but does get regular mammograms done. No new complaints at this time.  Patient states she ran out of tamoxifen approximately 2 weeks ago. She states she doesn't have the money to refill her prescription.    HPI  Past Medical History  Diagnosis Date  . Cancer 2011    left breast  . Personal history of tobacco use, presenting hazards to health 2013  . Obesity, unspecified 2013  . Malignant neoplasm of upper-outer quadrant of female breast 2011    left breast    Past Surgical History  Procedure Laterality Date  . Cesarean section  1989  . Portacath placement  2011  . Breast surgery Left 2011    lumpectomy radiation and chemo.    History reviewed. No pertinent family history.  Social History History  Substance Use Topics  . Smoking status: Former Smoker -- 2 years    Types: Cigarettes  . Smokeless tobacco: Never Used     Comment: 1-2 cigarettes a day  . Alcohol Use: No    Allergies  Allergen Reactions  . Aleve [Naproxen Sodium] Other (See Comments)    Stomach pain/gas pains/upset stomach    Current Outpatient Prescriptions  Medication Sig Dispense Refill  . tamoxifen (NOLVADEX) 20 MG tablet Take 20 mg by mouth daily.     No current facility-administered medications for this visit.    Review of Systems Review of Systems  Constitutional: Negative.   Respiratory: Negative.   Cardiovascular: Negative.     Blood pressure 120/78, pulse 78, resp. rate 12, height 5\' 2"  (1.575 m), weight 155 lb (70.308 kg).  Physical Exam Physical Exam  Constitutional: She is oriented to person, place, and time. She appears well-developed  and well-nourished.  Eyes: Conjunctivae are normal. No scleral icterus.  Neck: Neck supple. No thyromegaly present.  Cardiovascular: Normal rate, regular rhythm and normal heart sounds.   No murmur heard. Pulmonary/Chest: Effort normal and breath sounds normal. Right breast exhibits no inverted nipple, no mass, no nipple discharge, no skin change and no tenderness. Left breast exhibits no inverted nipple, no mass, no nipple discharge, no skin change and no tenderness.  Lymphadenopathy:    She has no cervical adenopathy.    She has no axillary adenopathy.  Neurological: She is alert and oriented to person, place, and time.  Skin: Skin is warm and dry.    Data Reviewed Mammogram was reviewed and stable.   Assessment    Stable exam. Patient 4.5 years out from left breast lumpectomy, chemo, radiation for invasive cancer T1,N0,M0.  Patient is on tamoxifen but has not taken any in a few weeks. Ran out of money.     Plan    Will discuss with oncology and [facility] regarding any available funds for her medication. Patient to follow up in August 2016.        SANKAR,SEEPLAPUTHUR G 09/03/2014, 10:17 AM

## 2014-12-11 NOTE — Op Note (Signed)
PATIENT NAME:  Sonya Perry, Sonya Perry MR#:  [id number] DATE OF BIRTH:  Apr 27, 1966  DATE OF PROCEDURE:  04/19/2012  PREOPERATIVE DIAGNOSIS: AGUS Pap smear.   POSTOPERATIVE DIAGNOSIS: Atrophic endometrium.   PROCEDURES PERFORMED:  1. Examination under anesthesia.  2. Hysteroscopy.  3. Dilation and curettage.   SURGEON: Weber Cooks, MD   ANESTHESIA: General.   ESTIMATED BLOOD LOSS: 25 mL.   COMPLICATIONS: None.   INDICATION FOR SURGERY: Ms. Canino is a 49 year old patient who is taking tamoxifen for breast cancer. Most recent Pap smear again revealed AGUS. 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   FINDING AT TIME OF SURGERY:  1. Normal external genitalia, urethral meatus, urethra, bladder, and vagina. Cervix scarred from conization, however, without lesion. Her uterus sounds to 7 cm.  2. Endometrium is atrophic without significant lesion, slightly thickened lining on the posterior wall.   OPERATIVE REPORT: After adequate general anesthesia had been obtained, the patient was prepped and draped in high lithotomy position. A speculum was inserted. The cervix was visualized and grasped with a single-tooth tenaculum. The scarring on the outside of the cervix was carefully incised. Then the cervical canal was determined first with a lacrimal duct probe and then slowly dilated. The uterus sounded to 7 cm throughout the procedure. Finally, the hysteroscope could be introduced and exploration done with the above-mentioned findings. No significant pathology was seen. After that, the endometrial cavity was curetted with a sharp serrated curette. Very little tissue was received. The tenaculum was then removed. Sponge count was correct. There was no bleeding at the end of the procedure. The patient tolerated the procedure  well and was taken to the recovery room in satisfactory condition.  ____________________________ Weber Cooks, MD bem:cbb D: 04/19/2012 16:59:38 ET T: 04/20/2012 09:55:40 ET JOB#: [id number]  cc: Weber Cooks, MD, <Dictator> Weber Cooks MD ELECTRONICALLY SIGNED 04/26/2012 9:26

## 2015-02-05 ENCOUNTER — Telehealth: Payer: Self-pay | Admitting: *Deleted

## 2015-02-05 DIAGNOSIS — C50912 Malignant neoplasm of unspecified site of left female breast: Secondary | ICD-10-CM

## 2015-02-05 MED ORDER — TAMOXIFEN CITRATE 20 MG PO TABS: 20.0000 mg | ORAL_TABLET | Freq: Every day | ORAL | Status: DC

## 2015-02-05 NOTE — Telephone Encounter (Signed)
Escribed

## 2015-02-07 ENCOUNTER — Other Ambulatory Visit: Payer: Self-pay | Admitting: Family Medicine

## 2015-02-07 DIAGNOSIS — Z1231 Encounter for screening mammogram for malignant neoplasm of breast: Secondary | ICD-10-CM

## 2015-02-12 ENCOUNTER — Other Ambulatory Visit: Payer: Self-pay

## 2015-02-12 ENCOUNTER — Ambulatory Visit: Payer: Self-pay | Admitting: Oncology

## 2015-03-02 IMAGING — US US PELV - US TRANSVAGINAL
1 series · 14 of 25 positions shown · non-contrast
Comparison: none

REASON FOR EXAM: Eval for endometrial thickening Nabothian cyst hist
breast Ca
COMMENTS:

PROCEDURE:     US  - US PELVIS EXAM W/TRANSVAGINAL  - February 23, 2013 [DATE]
RESULT:     Comparison: 03/31/2012
TECHNIQUE: Multiple grayscale and color Doppler images were obtained of the
pelvis via transabdominal and endovaginal ultrasound.

[Series 1: us pelv - us transvaginal · 0.28mm/px · 14 of 75 slices shown]
[im 1/75]
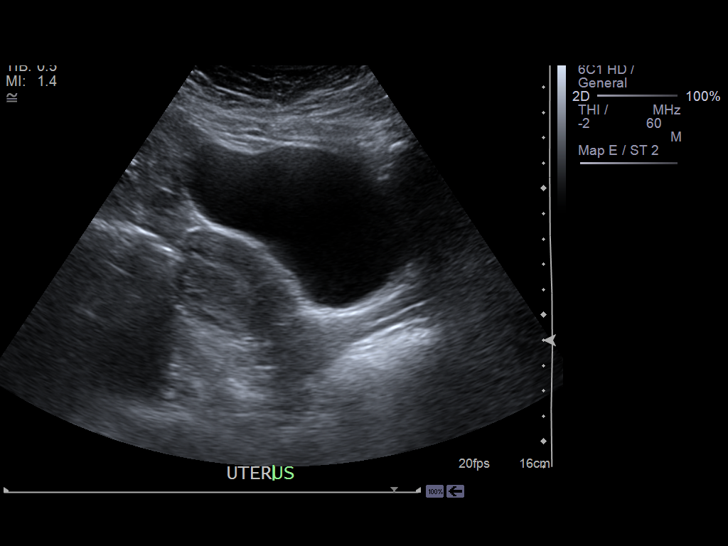
[im 7/75]
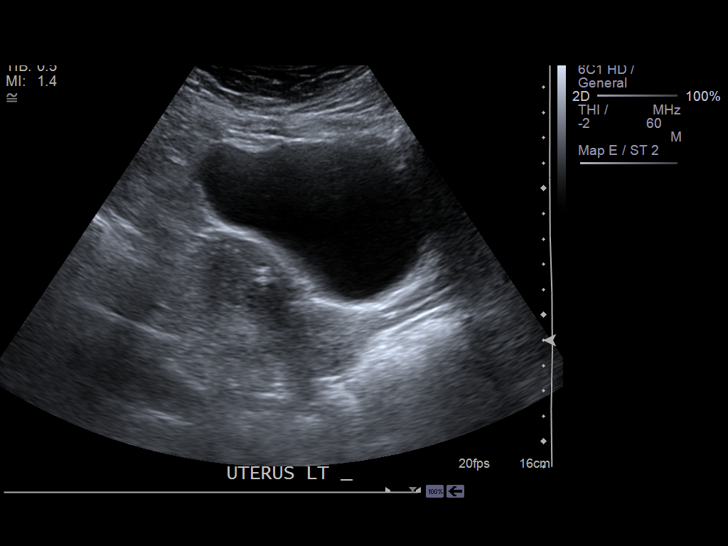
[im 13/75]
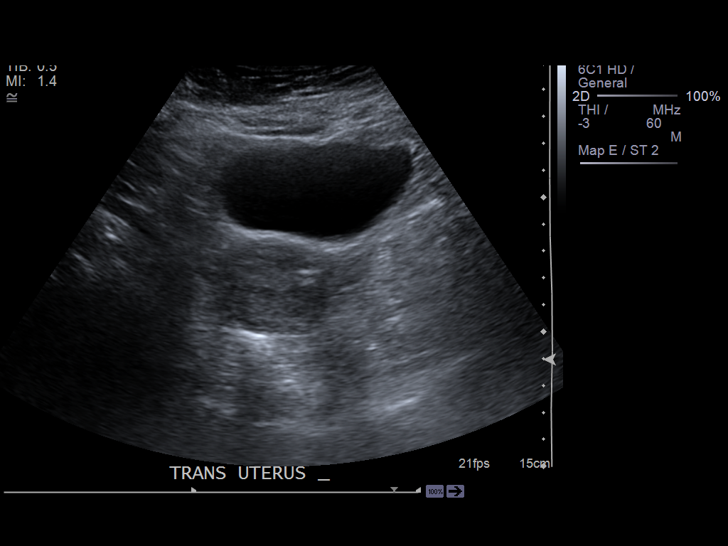
[im 19/75]
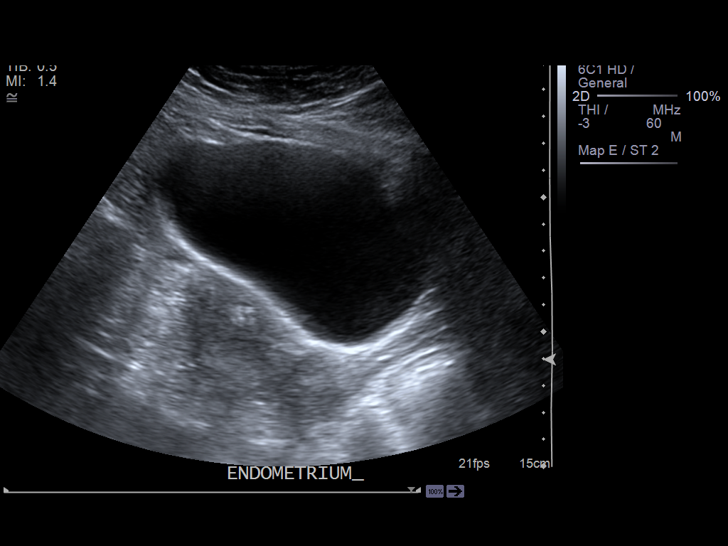
[im 25/75]
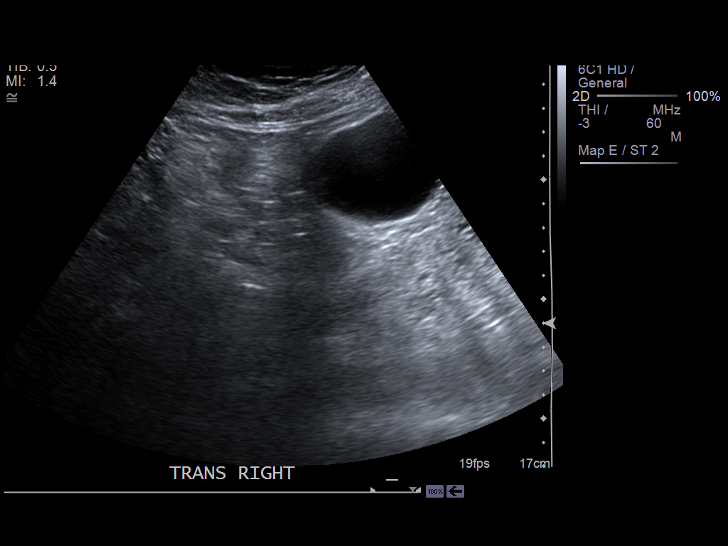
[im 28/75]
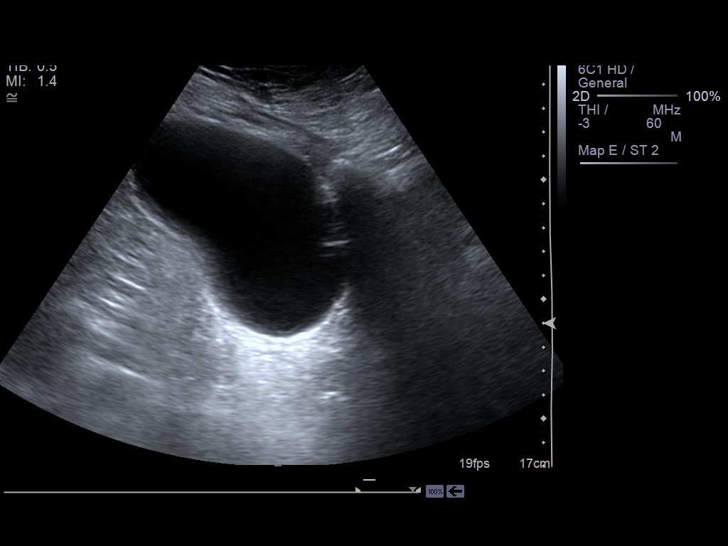
[im 34/75]
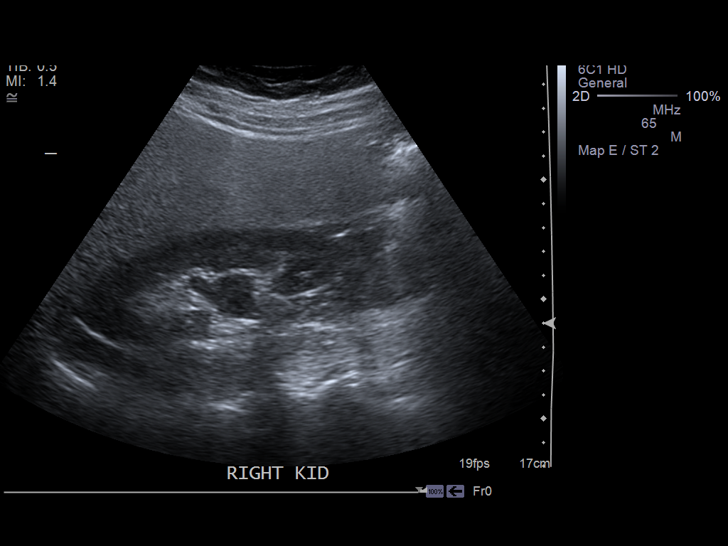
[im 41/75]
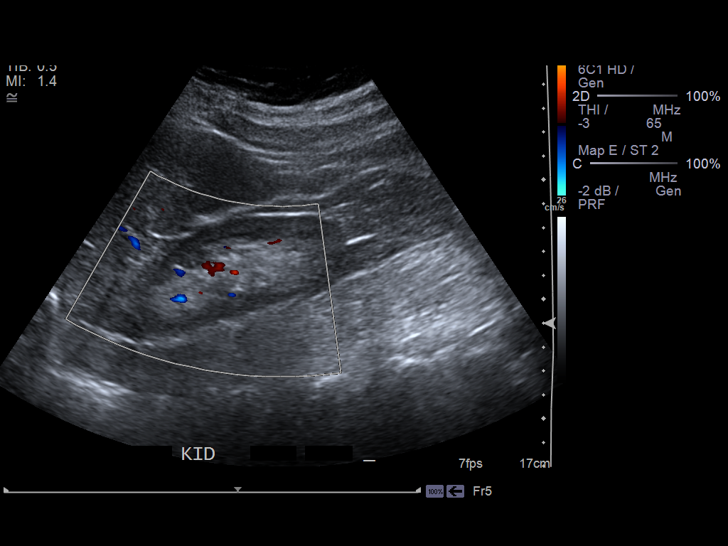
[im 47/75]
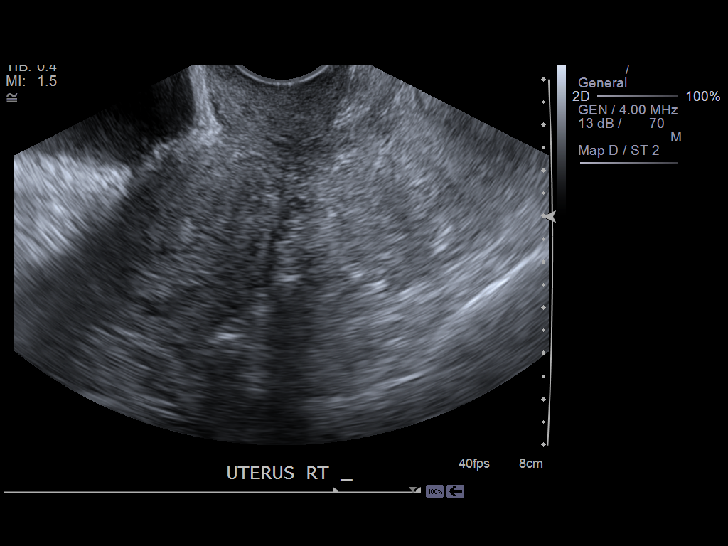
[im 50/75]
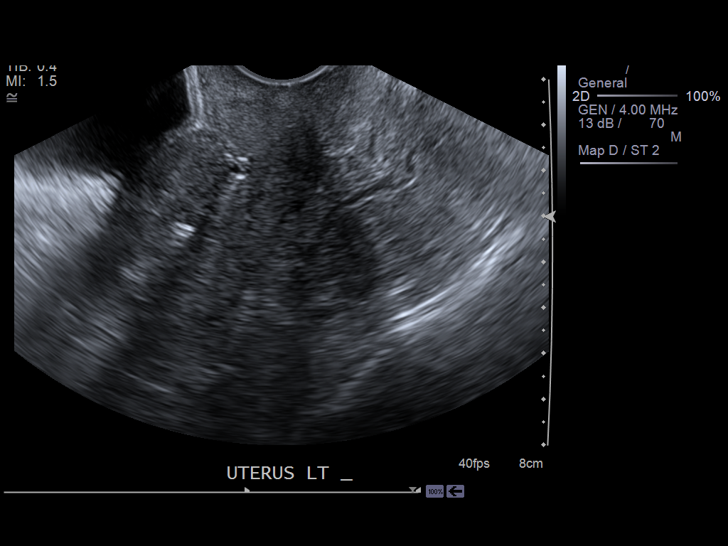
[im 56/75]
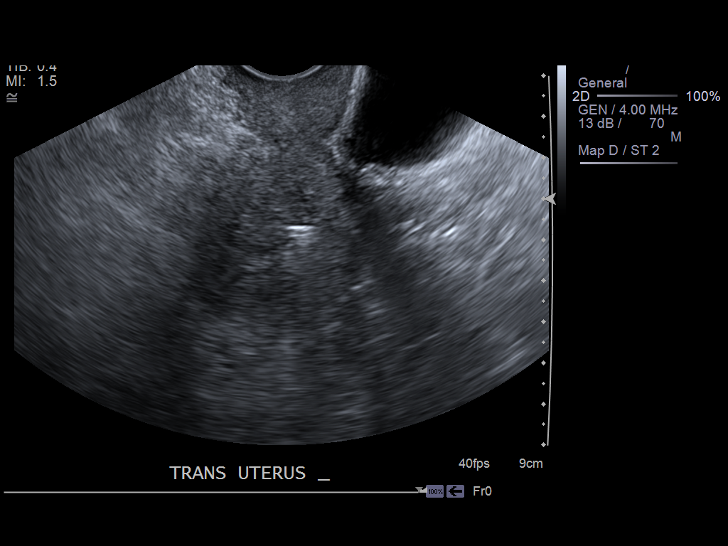
[im 62/75]
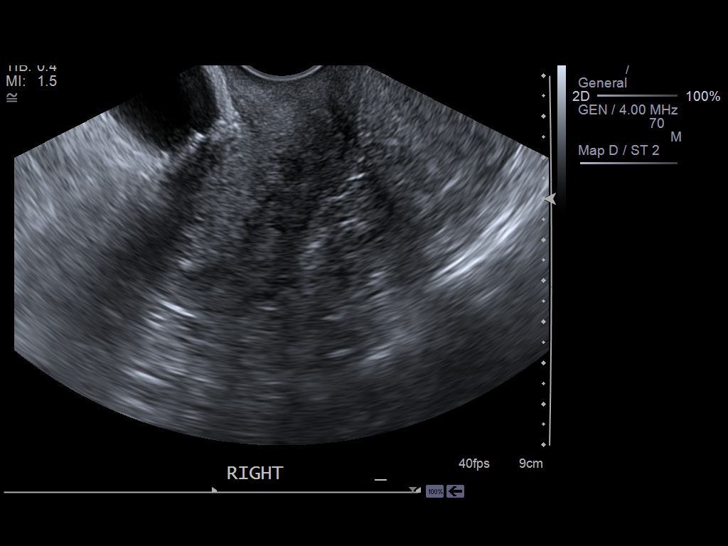
[im 68/75]
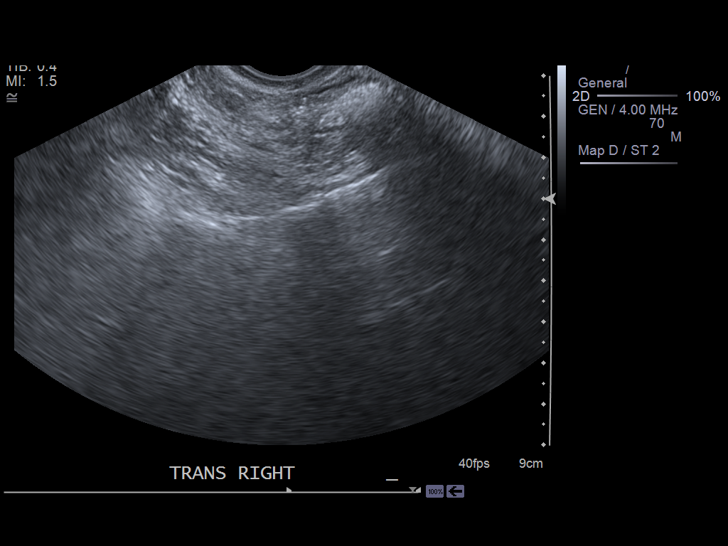
[im 75/75]
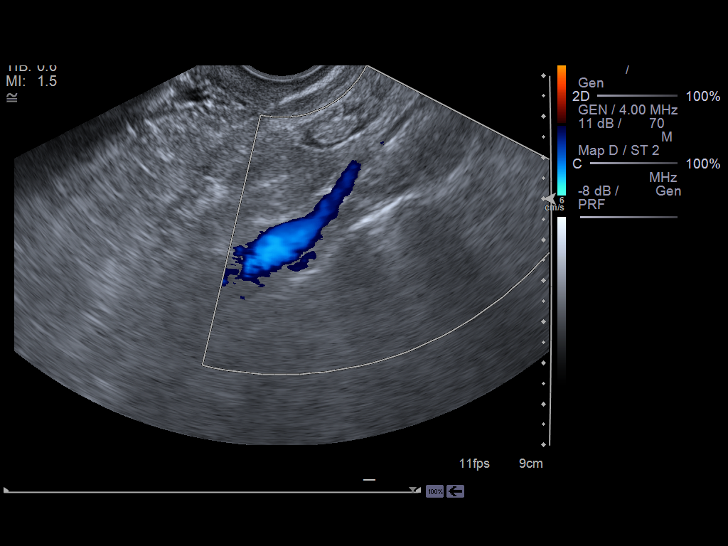

[14 of 25 positions shown; findings below may reference images not displayed]

FINDINGS: The uterus measures 7.6 x 4.5 x 3.5 cm. There multiple small hyperechoic
foci within the uterus which likely represent calcifications. The
endometrial stripe was not well visualized with endovaginal ultrasound.
However, the endometrial stripe measured 6-7 mm by transabdominal ultrasound.

The ovaries were not visualized.

There is slight fullness of the right renal pelvis, which is nonspecific.
IMPRESSION: The endometrial stripe is mildly thickened. In a postmenopausal patient with
bleeding, differential would include endometrial hyperplasia, endometrial
polyp, endometrial carcinoma. The thickness appears decreased from prior.

[REDACTED]

## 2015-03-08 ENCOUNTER — Other Ambulatory Visit: Payer: Self-pay | Admitting: *Deleted

## 2015-03-08 DIAGNOSIS — C50419 Malignant neoplasm of upper-outer quadrant of unspecified female breast: Secondary | ICD-10-CM

## 2015-03-11 ENCOUNTER — Inpatient Hospital Stay: Payer: Medicaid Other | Attending: Oncology | Admitting: Oncology

## 2015-03-11 ENCOUNTER — Inpatient Hospital Stay: Payer: Medicaid Other

## 2015-03-11 ENCOUNTER — Encounter: Payer: Self-pay | Admitting: Oncology

## 2015-03-11 VITALS — BP 137/92 | HR 88 | Temp 96.5°F | Ht 62.0 in | Wt 150.4 lb

## 2015-03-11 DIAGNOSIS — E669 Obesity, unspecified: Secondary | ICD-10-CM | POA: Diagnosis not present

## 2015-03-11 DIAGNOSIS — Z87891 Personal history of nicotine dependence: Secondary | ICD-10-CM | POA: Insufficient documentation

## 2015-03-11 DIAGNOSIS — C50412 Malignant neoplasm of upper-outer quadrant of left female breast: Secondary | ICD-10-CM | POA: Diagnosis not present

## 2015-03-11 DIAGNOSIS — Z79810 Long term (current) use of selective estrogen receptor modulators (SERMs): Secondary | ICD-10-CM

## 2015-03-11 DIAGNOSIS — C50419 Malignant neoplasm of upper-outer quadrant of unspecified female breast: Secondary | ICD-10-CM

## 2015-03-11 DIAGNOSIS — Z8669 Personal history of other diseases of the nervous system and sense organs: Secondary | ICD-10-CM | POA: Diagnosis not present

## 2015-03-11 DIAGNOSIS — Z923 Personal history of irradiation: Secondary | ICD-10-CM | POA: Insufficient documentation

## 2015-03-11 DIAGNOSIS — Z170 Estrogen receptor positive status [ER+]: Secondary | ICD-10-CM | POA: Insufficient documentation

## 2015-03-11 DIAGNOSIS — D649 Anemia, unspecified: Secondary | ICD-10-CM | POA: Diagnosis not present

## 2015-03-11 DIAGNOSIS — Z9221 Personal history of antineoplastic chemotherapy: Secondary | ICD-10-CM | POA: Insufficient documentation

## 2015-03-11 DIAGNOSIS — M545 Low back pain: Secondary | ICD-10-CM | POA: Insufficient documentation

## 2015-03-11 DIAGNOSIS — R896 Abnormal cytological findings in specimens from other organs, systems and tissues: Secondary | ICD-10-CM | POA: Diagnosis not present

## 2015-03-11 LAB — CBC WITH DIFFERENTIAL/PLATELET
BASOS ABS: 0.1 10*3/uL (ref 0–0.1)
BASOS PCT: 1 %
EOS ABS: 0.1 10*3/uL (ref 0–0.7)
EOS PCT: 2 %
HCT: 37.6 % (ref 35.0–47.0)
HEMOGLOBIN: 13.1 g/dL (ref 12.0–16.0)
LYMPHS ABS: 1.8 10*3/uL (ref 1.0–3.6)
Lymphocytes Relative: 29 %
MCH: 32.9 pg (ref 26.0–34.0)
MCHC: 34.8 g/dL (ref 32.0–36.0)
MCV: 94.4 fL (ref 80.0–100.0)
MONO ABS: 0.3 10*3/uL (ref 0.2–0.9)
Monocytes Relative: 5 %
Neutro Abs: 4 10*3/uL (ref 1.4–6.5)
Neutrophils Relative %: 63 %
Platelets: 250 10*3/uL (ref 150–440)
RBC: 3.98 MIL/uL (ref 3.80–5.20)
RDW: 13.6 % (ref 11.5–14.5)
WBC: 6.2 10*3/uL (ref 3.6–11.0)

## 2015-03-11 LAB — COMPREHENSIVE METABOLIC PANEL
ALK PHOS: 50 U/L (ref 38–126)
ALT: 27 U/L (ref 14–54)
AST: 28 U/L (ref 15–41)
Albumin: 4 g/dL (ref 3.5–5.0)
Anion gap: 7 (ref 5–15)
BUN: 7 mg/dL (ref 6–20)
CO2: 24 mmol/L (ref 22–32)
Calcium: 9 mg/dL (ref 8.9–10.3)
Chloride: 104 mmol/L (ref 101–111)
Creatinine, Ser: 0.58 mg/dL (ref 0.44–1.00)
GFR calc Af Amer: >60 mL/min (ref >60)
GFR calc non Af Amer: >60 mL/min (ref >60)
Glucose, Bld: 193 mg/dL — ABNORMAL HIGH (ref 65–99)
Potassium: 4 mmol/L (ref 3.5–5.1)
SODIUM: 135 mmol/L (ref 135–145)
TOTAL PROTEIN: 7.1 g/dL (ref 6.5–8.1)
Total Bilirubin: 0.8 mg/dL (ref 0.3–1.2)

## 2015-03-16 ENCOUNTER — Encounter: Payer: Self-pay | Admitting: Oncology

## 2015-03-16 NOTE — Progress Notes (Signed)
[place] @ [facility] [phone]  Fax:([phone]) Roberts: 1966/02/26  MR#: [id number]  ZSM#:[id number]  Patient Care Team: No Pcp Per Patient as PCP - General (General Practice) Seeplaputhur Robinette Haines, MD (General Surgery) Rico Junker, RN as Registered Nurse (Unknown Physician Specialty)  CHIEF COMPLAINT:  Chief Complaint  Patient presents with  . Follow-up    Breast Cancer    Oncology History   1. Carcinoma of breast T2, N1, M0 tumor. Estrogen receptor positive progesterone receptor positive,  HER-2 negative by be  FISH  study. 2. Patient finished neoadjuvant chemotherapy in August of 2011. 3. April 16, 2010, patient had lumpectomy,axillary node dissection. 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 May 14, 2010, Patient had further wide excision and negative for invasive cancer positive for ductal carcinoma in situ. Margins are clear. 5. Chemoradiation his finished in December of 2011. 6. Tamoxifen started in December of 2011     Malignant neoplasm of upper-outer quadrant of female breast    Initial Diagnosis Malignant neoplasm of upper-outer quadrant of female breast     INTERVAL HISTORY:  49 year old lady with a history of carcinoma of breast. Patient is taking tamoxifen getting regular mammograms.  Patient is also being evaluated by GYN oncologist for abnormal Pap smear No bony pains appetite has been stable REVIEW OF SYSTEMS:   GENERAL:  Feels good.  Active.  No fevers, sweats or weight loss. PERFORMANCE STATUS (ECOG):0 HEENT:  No visual changes, runny nose, sore throat, mouth sores or tenderness. Lungs: No shortness of breath or cough.  No hemoptysis. Cardiac:  No chest pain, palpitations, orthopnea, or PND. GI:  No nausea, vomiting, diarrhea, constipation, melena or hematochezia. GU:  No urgency, frequency, dysuria, or hematuria. Musculoskeletal:  No back pain.  No joint pain.  No  muscle tenderness. Extremities:  No pain or swelling. Skin:  No rashes or skin changes. Neuro:  No headache, numbness or weakness, balance or coordination issues. Endocrine:  No diabetes, thyroid issues, hot flashes or night sweats. Psych:  No mood changes, depression or anxiety. Pain:  No focal pain. Review of systems:  All other systems reviewed and found to be negative. As per HPI. Otherwise, a complete review of systems is negatve.  PAST MEDICAL HISTORY: Past Medical History  Diagnosis Date  . Cancer 2011    left breast  . Personal history of tobacco use, presenting hazards to health 2013  . Obesity, unspecified 2013  . 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 Cesarean section  1989  . Portacath placement  2011  . Breast surgery Left 2011    lumpectomy radiation and chemo.  Significant History/PMH:   Low back pain:    Left Breast Cancer: 2011   Anemia:    migraines:    Port-A-Cath Removal: 2012   Left Lumpectomy:    Cesarean Section:    Port A Cath/ Right:   Preventive Screening:  Has patient had any of the following test? Mammography  Pap Smear (1)   Last Mammography: 2/14(1)   Last Pap Smear: 2013(1)   Smoking History: Smoking History quit 5 years ago(1)  PFSH: Comments: No family history of colorectal cancer, breast cancer, or ovarian cancer.  Social History: negative tobacco, social  use of alcohol  Additional Past Medical and Surgical History: Please  see above   ADVANCED DIRECTIVES:  No flowsheet data found.  HEALTH MAINTENANCE: History  Substance Use Topics  . Smoking status: Former Smoker -- 2 years    Types: Cigarettes  . Smokeless tobacco: Never Used     Comment: 1-2 cigarettes a day  . Alcohol Use: No      Allergies  Allergen Reactions  . Aleve [Naproxen Sodium] Other (See Comments)    Stomach pain/gas  pains/upset stomach    Current Outpatient Prescriptions  Medication Sig Dispense Refill  . tamoxifen (NOLVADEX) 20 MG tablet Take 1 tablet (20 mg total) by mouth daily. 30 tablet 0   No current facility-administered medications for this visit.    OBJECTIVE:  Filed Vitals:   03/11/15 1110  BP: 137/92  Pulse: 88  Temp: 96.5 F (35.8 C)     Body mass index is 27.49 kg/(m^2).    ECOG FS:0 - Asymptomatic  PHYSICAL EXAM: GENERAL  status: Performance status is good.  Patient has not lost significant weight HEENT: No evidence of stomatitis. Sclera and conjunctivae :: No jaundice.   pale looking. Lungs: Air  entry equal on both sides.  No rhonchi.  No rales.  Cardiac: Heart sounds are normal.  No pericardial rub.  No murmur. Lymphatic system: Cervical, axillary, inguinal, lymph nodes not palpable GI: Abdomen is soft.  No ascites.  Liver spleen not palpable.  No tenderness.  Bowel sounds are within normal limit Lower extremity: No edema Neurological system: Higher functions, cranial nerves intact no evidence of peripheral neuropathy. Skin: No rash.  No ecchymosis.. Breast: No palpable masses.  Previous radiation.  Right breast free of masses.  LAB RESULTS:  Appointment on 03/11/2015  Component Date Value Ref Range Status  . WBC 03/11/2015 6.2  3.6 - 11.0 K/uL Final  . RBC 03/11/2015 3.98  3.80 - 5.20 MIL/uL Final  . Hemoglobin 03/11/2015 13.1  12.0 - 16.0 g/dL Final  . HCT 03/11/2015 37.6  35.0 - 47.0 % Final  . MCV 03/11/2015 94.4  80.0 - 100.0 fL Final  . MCH 03/11/2015 32.9  26.0 - 34.0 pg Final  . MCHC 03/11/2015 34.8  32.0 - 36.0 g/dL Final  . RDW 03/11/2015 13.6  11.5 - 14.5 % Final  . Platelets 03/11/2015 250  150 - 440 K/uL Final  . Neutrophils Relative % 03/11/2015 63   Final  . Neutro Abs 03/11/2015 4.0  1.4 - 6.5 K/uL Final  . Lymphocytes Relative 03/11/2015 29   Final  . Lymphs Abs 03/11/2015 1.8  1.0 - 3.6 K/uL Final  . Monocytes Relative 03/11/2015 5   Final  .  Monocytes Absolute 03/11/2015 0.3  0.2 - 0.9 K/uL Final  . Eosinophils Relative 03/11/2015 2   Final  . Eosinophils Absolute 03/11/2015 0.1  0 - 0.7 K/uL Final  . Basophils Relative 03/11/2015 1   Final  . Basophils Absolute 03/11/2015 0.1  0 - 0.1 K/uL Final  . Sodium 03/11/2015 135  135 - 145 mmol/L Final  . Potassium 03/11/2015 4.0  3.5 - 5.1 mmol/L Final  . Chloride 03/11/2015 104  101 - 111 mmol/L Final  . CO2 03/11/2015 24  22 - 32 mmol/L Final  . Glucose, Bld 03/11/2015 193* 65 - 99 mg/dL Final  . BUN 03/11/2015 7  6 - 20 mg/dL Final  . Creatinine, Ser 03/11/2015 0.58  0.44 - 1.00 mg/dL Final  . Calcium 03/11/2015 9.0  8.9 - 10.3 mg/dL Final  . Total Protein 03/11/2015 7.1  6.5 -  8.1 g/dL Final  . Albumin 03/11/2015 4.0  3.5 - 5.0 g/dL Final  . AST 03/11/2015 28  15 - 41 U/L Final  . ALT 03/11/2015 27  14 - 54 U/L Final  . Alkaline Phosphatase 03/11/2015 50  38 - 126 U/L Final  . Total Bilirubin 03/11/2015 0.8  0.3 - 1.2 mg/dL Final  . GFR calc non Af Amer 03/11/2015 >60  >60 mL/min Final  . GFR calc Af Amer 03/11/2015 >60  >60 mL/min Final   Comment: (NOTE) The eGFR has been calculated using the CKD EPI equation. This calculation has not been validated in all clinical situations. eGFR's persistently <60 mL/min signify possible Chronic Kidney Disease.   . Anion gap 03/11/2015 7  5 - 15 Final     ASSESSMENT: Carcinoma of left breast there is no evidence of recurrent or progressive disease continue tamoxifen until December of 2016 then patient's blood specimen will be sent for breast cancer index would decide on extended adjuvant therapy  MEDICAL DECISION MAKING:  Mammogram as been ordered for the August of 2016 mammogram in August of 2015 was birad 1 . Continue tamoxifen until December of 2016  Patient expressed understanding and was in agreement with this plan. She also understands that She can call clinic at any time with any questions, concerns, or complaints.    No  matching staging information was found for the patient.  Forest Gleason, MD   03/16/2015 6:17 PM

## 2015-03-26 ENCOUNTER — Encounter: Payer: Self-pay | Admitting: *Deleted

## 2015-03-27 ENCOUNTER — Encounter: Payer: Self-pay | Admitting: Oncology

## 2015-04-04 ENCOUNTER — Emergency Department
Admission: EM | Admit: 2015-04-04 | Discharge: 2015-04-04 | Disposition: A | Discharge status: Home / Self Care | Payer: Medicaid Other | Attending: Emergency Medicine | Admitting: Emergency Medicine

## 2015-04-04 ENCOUNTER — Encounter: Payer: Self-pay | Admitting: *Deleted

## 2015-04-04 DIAGNOSIS — Y9289 Other specified places as the place of occurrence of the external cause: Secondary | ICD-10-CM | POA: Diagnosis not present

## 2015-04-04 DIAGNOSIS — W540XXA Bitten by dog, initial encounter: Secondary | ICD-10-CM | POA: Diagnosis not present

## 2015-04-04 DIAGNOSIS — S01311A Laceration without foreign body of right ear, initial encounter: Secondary | ICD-10-CM | POA: Diagnosis not present

## 2015-04-04 DIAGNOSIS — Z87891 Personal history of nicotine dependence: Secondary | ICD-10-CM | POA: Diagnosis not present

## 2015-04-04 DIAGNOSIS — S0181XA Laceration without foreign body of other part of head, initial encounter: Secondary | ICD-10-CM | POA: Insufficient documentation

## 2015-04-04 DIAGNOSIS — Y9389 Activity, other specified: Secondary | ICD-10-CM | POA: Diagnosis not present

## 2015-04-04 DIAGNOSIS — Z23 Encounter for immunization: Secondary | ICD-10-CM | POA: Insufficient documentation

## 2015-04-04 DIAGNOSIS — Y998 Other external cause status: Secondary | ICD-10-CM | POA: Insufficient documentation

## 2015-04-04 DIAGNOSIS — S0185XA Open bite of other part of head, initial encounter: Secondary | ICD-10-CM

## 2015-04-04 HISTORY — DX: Bitten by dog, initial encounter: W54.0XXA

## 2015-04-04 HISTORY — DX: Open bite of other part of head, initial encounter: S01.85XA

## 2015-04-04 MED ORDER — MUPIROCIN 2 % EX OINT: TOPICAL_OINTMENT | CUTANEOUS | Status: DC

## 2015-04-04 MED ORDER — TETANUS-DIPHTH-ACELL PERTUSSIS 5-2.5-18.5 LF-MCG/0.5 IM SUSP
0.5000 mL | Freq: Once | INTRAMUSCULAR | Status: AC
  Administered 2015-04-04: 0.5 mL via INTRAMUSCULAR
  Filled 2015-04-04: qty 0.5

## 2015-04-04 MED ORDER — CLINDAMYCIN HCL 150 MG PO CAPS
300.0000 mg | ORAL_CAPSULE | Freq: Once | ORAL | Status: AC
  Administered 2015-04-04: 300 mg via ORAL
  Filled 2015-04-04 (×3): qty 2

## 2015-04-04 MED ORDER — RABIES IMMUNE GLOBULIN 150 UNIT/ML IM INJ
20.0000 [IU]/kg | INJECTION | Freq: Once | INTRAMUSCULAR | Status: AC
  Administered 2015-04-04: 1350 [IU] via INTRAMUSCULAR
  Filled 2015-04-04: qty 9

## 2015-04-04 MED ORDER — RABIES VACCINE, PCEC IM SUSR
1.0000 mL | Freq: Once | INTRAMUSCULAR | Status: AC
  Administered 2015-04-04: 1 mL via INTRAMUSCULAR
  Filled 2015-04-04: qty 1

## 2015-04-04 MED ORDER — OXYCODONE-ACETAMINOPHEN 5-325 MG PO TABS
2.0000 | ORAL_TABLET | Freq: Once | ORAL | Status: AC
  Administered 2015-04-04: 2 via ORAL
  Filled 2015-04-04: qty 2

## 2015-04-04 MED ORDER — OXYCODONE-ACETAMINOPHEN 5-325 MG PO TABS: 1.0000 | ORAL_TABLET | Freq: Four times a day (QID) | ORAL | Status: DC | PRN

## 2015-04-04 MED ORDER — LIDOCAINE HCL (PF) 1 % IJ SOLN
INTRAMUSCULAR | Status: AC
  Administered 2015-04-04: 15:00:00
  Filled 2015-04-04: qty 5

## 2015-04-04 MED ORDER — CLINDAMYCIN HCL 300 MG PO CAPS: 300.0000 mg | ORAL_CAPSULE | Freq: Three times a day (TID) | ORAL | Status: DC

## 2015-04-04 MED ORDER — BACITRACIN ZINC 500 UNIT/GM EX OINT
TOPICAL_OINTMENT | Freq: Every day | CUTANEOUS | Status: DC
  Administered 2015-04-04: 1 via TOPICAL

## 2015-04-04 MED ORDER — BACITRACIN ZINC 500 UNIT/GM EX OINT
TOPICAL_OINTMENT | CUTANEOUS | Status: AC
  Administered 2015-04-04: 1 via TOPICAL
  Filled 2015-04-04: qty 0.9

## 2015-04-04 MED ORDER — LIDOCAINE-EPINEPHRINE (PF) 1 %-1:200000 IJ SOLN
INTRAMUSCULAR | Status: AC
  Administered 2015-04-04: 13:00:00
  Filled 2015-04-04: qty 30

## 2015-04-04 MED ORDER — DIAZEPAM 5 MG PO TABS
10.0000 mg | ORAL_TABLET | Freq: Once | ORAL | Status: AC
  Administered 2015-04-04: 10 mg via ORAL
  Filled 2015-04-04: qty 2

## 2015-04-04 MED ORDER — MUPIROCIN 2 % EX OINT: TOPICAL_OINTMENT | Freq: Once | CUTANEOUS | Status: DC

## 2015-04-04 MED ORDER — DIAZEPAM 5 MG PO TABS: 5.0000 mg | ORAL_TABLET | Freq: Once | ORAL | Status: DC

## 2015-04-04 NOTE — ED Notes (Signed)
Mult dog bites to right ear and face

## 2015-04-04 NOTE — ED Provider Notes (Signed)
[facility] Emergency Department Provider Note     Time seen: ----------------------------------------- 9:38 AM on 04/04/2015 -----------------------------------------    I have reviewed the triage vital signs and the nursing notes.   HISTORY  Chief Complaint Animal Bite    HPI Sonya Perry is a 49 y.o. female who presents ER after multiple dog bites to the right ear and face. Patient reports it's her dog, unvaccinated, unsure of her last tetanus shot. There are multiple jagged facial lacerations on the right side particularly involving the right ear. Patient complaining of pain in these areas that is mild, denies any other complaints.   Past Medical History  Diagnosis Date  . Cancer 2011    left breast  . Personal history of tobacco use, presenting hazards to health 2013  . Obesity, unspecified 2013  . Malignant neoplasm of upper-outer quadrant of female breast 2011    left breast    Patient Active Problem List   Diagnosis Date Noted  . History of breast cancer 05/02/2013  . Cancer   . 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 Cesarean section  1989  . Portacath placement  2011  . Breast surgery Left 2011    lumpectomy radiation and chemo.    Allergies Aleve  Social History Social History  Substance Use Topics  . Smoking status: Former Smoker -- 2 years    Types: Cigarettes  . Smokeless tobacco: Never Used     Comment: 1-2 cigarettes a day  . Alcohol Use: No    Review of Systems Constitutional: Negative for fever. Musculoskeletal: Denies any extremity injury Skin: Positive for multiple lacerations involving the right face and ear  ____________________________________________   PHYSICAL EXAM:  VITAL SIGNS: ED Triage Vitals  Enc Vitals Group     BP 04/04/15 0934 164/9 mmHg     Pulse Rate 04/04/15 0934 98     Resp 04/04/15 0934 20   Temp 04/04/15 0934 98.8 F (37.1 C)     Temp Source 04/04/15 0934 Oral     SpO2 04/04/15 0934 98 %     Weight 04/04/15 0934 150 lb (68.04 kg)     Height 04/04/15 0934 5\' 2"  (1.575 m)     Head Cir --      Peak Flow --      Pain Score 04/04/15 0933 4     Pain Loc --      Pain Edu? --      Excl. in [place]? --     Constitutional: Alert and oriented. Well appearing and in no distress. Anxious Eyes: Conjunctivae are normal. PERRL. Normal extraocular movements. ENT   Head: There are numerous right facial lacerations, deep located just inferior to the right zygoma.   Nose: No congestion/rhinnorhea.   Mouth/Throat: Mucous membranes are moist.   Neck: No stridor.      Ears: Right ear has several lacerations and macerations tickly involving the lobule of the auricle, intertragic notch and antitragus Musculoskeletal: Nontender with normal range of motion in all extremities. No extremity injuries or bites Skin:  Multiple facial lacerations particularly inferior to the right side, and approaching the right side upper lip, right ear and periauricular area. Psychiatric: Mood and affect are normal.  ____________________________________________  ED COURSE:  Pertinent labs & imaging results that were available during my care of the patient were reviewed by me and considered in my medical decision making (see chart  for details). Patient being given Percocet and Valium for for pain and relaxation. This will require extensive laceration repair, she'll also need a TDAP, rabies vaccination and immunoglobulin ____________________________________________  LACERATION REPAIR Performed by: Earleen Newport Authorized by: Lenise Arena E Consent: Verbal consent obtained. Risks and benefits: risks, benefits and alternatives were discussed Consent given by: patient Patient identity confirmed: provided demographic data Prepped and Draped in normal sterile fashion Wound explored  Laceration  Location: Face, right ear  Laceration Length: 30 cm   No Foreign Bodies seen or palpated  Anesthesia: local infiltration  Local anesthetic: lidocaine 1% % with and without epinephrine  Anesthetic total: 10 ml  Irrigation method: syringe Amount of cleaning: standard  The right side of her face required a complex multilayer closure, 5-0 Vicryl was used to reapproximate the wound deeply with approximate 5 sutures placed. and skin was closed with 5-0 Ethilon in running technique. There was good cosmetic closure. Approximately 25 sutures were used on the right side of her face.  The right ear had a nearly avulsed lobule of the auricle. Extensive laceration down into the cartilage of the antitragus was reapproximated with 5-0 Vicryl. Overlying skin and flap of the lobule of the auricle was closed with a running 6-0 Prolene. Approximately 30 sutures were placed here.  The right lower chin area had a maceration that was cleaned and closed with Dermabond.  Skin closure: 50 and 6-0 Ethilon, 6-0 Prolene . Deep sutures with 5-0 Vicryl   Number of sutures: 60   Technique: Running and simple interrupted   Patient tolerance: Patient tolerated the procedure well with no immediate complications.   FINAL ASSESSMENT AND PLAN  Complex facial laceration after dog bite  Plan: Patient with labs and imaging as dictated above. Patient has a dog bite injury that requires cosmetic closure due to its location. Wounds were copiously irrigated and cleaned with Betadine prior to closure. She was given oral clindamycin here, will be discharged with oral and topical antibiotic ointment as well as pain medicine. She is encouraged follow-up here in 1-2 days for reevaluation. She also received tetanus update as well as rabies immunoglobulin and vaccine. She will need to continue the course of rabies vaccination.   Earleen Newport, MD   Earleen Newport, MD 04/04/15 [phone]

## 2015-04-04 NOTE — ED Notes (Addendum)
MD at bedside. 

## 2015-04-04 NOTE — Discharge Instructions (Signed)
Animal Bite Animal bite wounds can get infected. It is important to get proper medical treatment. Ask your doctor if you need a rabies shot. HOME CARE   Follow your doctor's instructions for taking care of your wound.  Only take medicine as told by your doctor.  Take your medicine (antibiotics) as told. Finish them even if you start to feel better.  Keep all doctor visits as told. You may need a tetanus shot if:   You cannot remember when you had your last tetanus shot.  You have never had a tetanus shot.  The injury broke your skin. If you need a tetanus shot and you choose not to have one, you may get tetanus. Sickness from tetanus can be serious. GET HELP RIGHT AWAY IF:   Your wound is warm, red, sore, or puffy (swollen).  You notice yellowish-white fluid (pus) or a bad smell coming from the wound.  You see a red line on the skin coming from the wound.  You have a fever, chills, or you feel sick.  You feel sick to your stomach (nauseous), or you throw up (vomit).  Your pain does not go away, or it gets worse.  You have trouble moving the injured part.  You have questions or concerns. MAKE SURE YOU:   Understand these instructions.  Will watch your condition.  Will get help right away if you are not doing well or get worse. Document Released: 08/10/2005 Document Revised: 11/02/2011 Document Reviewed: 04/01/2011 [facility] Patient Information 2015 [place], [place]. This information is not intended to replace advice given to you by your health care provider. Make sure you discuss any questions you have with your health care provider.  Facial Laceration  A facial laceration is a cut on the face. These injuries can be painful and cause bleeding. Lacerations usually heal quickly, but they need special care to reduce scarring. DIAGNOSIS  Your health care provider will take a medical history, ask for details about how the injury occurred, and examine the wound to determine  how deep the cut is. TREATMENT  Some facial lacerations may not require closure. Others may not be able to be closed because of an increased risk of infection. The risk of infection and the chance for successful closure will depend on various factors, including the amount of time since the injury occurred. The wound may be cleaned to help prevent infection. If closure is appropriate, pain medicines may be given if needed. Your health care provider will use stitches (sutures), wound glue (adhesive), or skin adhesive strips to repair the laceration. These tools bring the skin edges together to allow for faster healing and a better cosmetic outcome. If needed, you may also be given a tetanus shot. HOME CARE INSTRUCTIONS  Only take over-the-counter or prescription medicines as directed by your health care provider.  Follow your health care provider's instructions for wound care. These instructions will vary depending on the technique used for closing the wound. For Sutures:  Keep the wound clean and dry.   If you were given a bandage (dressing), you should change it at least once a day. Also change the dressing if it becomes wet or dirty, or as directed by your health care provider.   Wash the wound with soap and water 2 times a day. Rinse the wound off with water to remove all soap. Pat the wound dry with a clean towel.   After cleaning, apply a thin layer of the antibiotic ointment recommended by your health  care provider. This will help prevent infection and keep the dressing from sticking.   You may shower as usual after the first 24 hours. Do not soak the wound in water until the sutures are removed.   Get your sutures removed as directed by your health care provider. With facial lacerations, sutures should usually be taken out after 4-5 days to avoid stitch marks.   Wait a few days after your sutures are removed before applying any makeup. For Skin Adhesive Strips:  Keep the wound  clean and dry.   Do not get the skin adhesive strips wet. You may bathe carefully, using caution to keep the wound dry.   If the wound gets wet, pat it dry with a clean towel.   Skin adhesive strips will fall off on their own. You may trim the strips as the wound heals. Do not remove skin adhesive strips that are still stuck to the wound. They will fall off in time.  For Wound Adhesive:  You may briefly wet your wound in the shower or bath. Do not soak or scrub the wound. Do not swim. Avoid periods of heavy sweating until the skin adhesive has fallen off on its own. After showering or bathing, gently pat the wound dry with a clean towel.   Do not apply liquid medicine, cream medicine, ointment medicine, or makeup to your wound while the skin adhesive is in place. This may loosen the film before your wound is healed.   If a dressing is placed over the wound, be careful not to apply tape directly over the skin adhesive. This may cause the adhesive to be pulled off before the wound is healed.   Avoid prolonged exposure to sunlight or tanning lamps while the skin adhesive is in place.  The skin adhesive will usually remain in place for 5-10 days, then naturally fall off the skin. Do not pick at the adhesive film.  After Healing: Once the wound has healed, cover the wound with sunscreen during the day for 1 full year. This can help minimize scarring. Exposure to ultraviolet light in the first year will darken the scar. It can take 1-2 years for the scar to lose its redness and to heal completely.  SEEK IMMEDIATE MEDICAL CARE IF:  You have redness, pain, or swelling around the wound.   You see ayellowish-white fluid (pus) coming from the wound.   You have chills or a fever.  MAKE SURE YOU:  Understand these instructions.  Will watch your condition.  Will get help right away if you are not doing well or get worse. Document Released: 09/17/2004 Document Revised: 05/31/2013  Document Reviewed: 03/23/2013 [facility] Patient Information 2015 [place], [place]. This information is not intended to replace advice given to you by your health care provider. Make sure you discuss any questions you have with your health care provider.

## 2015-04-05 ENCOUNTER — Ambulatory Visit: Payer: Self-pay

## 2015-04-05 ENCOUNTER — Ambulatory Visit
Admission: RE | Admit: 2015-04-05 | Discharge: 2015-04-05 | Disposition: A | Discharge status: Home / Self Care | Payer: Medicaid Other | Source: Ambulatory Visit | Attending: Family Medicine | Admitting: Family Medicine

## 2015-04-05 ENCOUNTER — Other Ambulatory Visit: Payer: Self-pay

## 2015-04-05 ENCOUNTER — Ambulatory Visit: Admission: RE | Admit: 2015-04-05 | Payer: Self-pay | Source: Ambulatory Visit

## 2015-04-05 DIAGNOSIS — Z853 Personal history of malignant neoplasm of breast: Secondary | ICD-10-CM | POA: Diagnosis present

## 2015-04-05 HISTORY — DX: Malignant neoplasm of unspecified site of unspecified female breast: C50.919

## 2015-04-09 ENCOUNTER — Encounter: Payer: Self-pay | Admitting: Emergency Medicine

## 2015-04-09 ENCOUNTER — Emergency Department
Admission: EM | Admit: 2015-04-09 | Discharge: 2015-04-09 | Disposition: A | Discharge status: Home / Self Care | Payer: Medicaid Other | Attending: Emergency Medicine | Admitting: Emergency Medicine

## 2015-04-09 DIAGNOSIS — Z87891 Personal history of nicotine dependence: Secondary | ICD-10-CM | POA: Insufficient documentation

## 2015-04-09 DIAGNOSIS — Z79899 Other long term (current) drug therapy: Secondary | ICD-10-CM | POA: Insufficient documentation

## 2015-04-09 DIAGNOSIS — Z792 Long term (current) use of antibiotics: Secondary | ICD-10-CM | POA: Diagnosis not present

## 2015-04-09 DIAGNOSIS — Z23 Encounter for immunization: Secondary | ICD-10-CM | POA: Diagnosis not present

## 2015-04-09 DIAGNOSIS — Z4801 Encounter for change or removal of surgical wound dressing: Secondary | ICD-10-CM | POA: Insufficient documentation

## 2015-04-09 DIAGNOSIS — Z5189 Encounter for other specified aftercare: Secondary | ICD-10-CM

## 2015-04-09 MED ORDER — RABIES VACCINE, PCEC IM SUSR
1.0000 mL | Freq: Once | INTRAMUSCULAR | Status: AC
  Administered 2015-04-09: 1 mL via INTRAMUSCULAR
  Filled 2015-04-09: qty 1

## 2015-04-09 NOTE — ED Notes (Signed)
Pt presents to ED for dog bite wound check. No redness or drainage noted.

## 2015-04-09 NOTE — ED Provider Notes (Signed)
[facility] Emergency Department Provider Note  ____________________________________________  Time seen: Approximately 4:15 PM  I have reviewed the triage vital signs and the nursing notes.   HISTORY  Chief Complaint Wound Check    HPI Sonya Perry is a 49 y.o. female presents to ER for complaints of wound check. Patient reports that she was seen in the ER on 04/04/2015 post dog bite. On 8/11 patient was bitten to the right upper arm and her right face by her dog. Reports that her dog was unvaccinated and rabies immunizations were initiated at time of visit to the ER. Patient reports that she presents today for wound check. Patient reports that she is feeling much better. Denies fever, drainage, redness or wound changes. Reports pain is minimal and intermittent. Denies constipation.  Denies other fall or other injury. Patient reports that the dog was approximately 50 pounds. Reports animal control was notified. States pain is currently 2 out of 10 aching and only when touched. Reports continues to take antibiotic as prescribed.   Past Medical History  Diagnosis Date  . Cancer 2011    left breast  . Personal history of tobacco use, presenting hazards to health 2013  . Obesity, unspecified 2013  . 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 Breast cancer 2011    left breast, chemo and radiation    Patient Active Problem List   Diagnosis Date Noted  . History of breast cancer 05/02/2013  . Cancer   . 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 Cesarean section  1989  . Portacath placement  2011  . Breast surgery Left 2011    lumpectomy radiation and chemo.  . Breast excisional biopsy Left 2011    positive    Current Outpatient Rx  Name  Route  Sig  Dispense  Refill  . clindamycin (CLEOCIN) 300 MG capsule   Oral   Take 1  capsule (300 mg total) by mouth 3 (three) times daily.   30 capsule   0   . mupirocin ointment (BACTROBAN) 2 %      Apply to affected area 3 times daily   22 g   0   . oxyCODONE-acetaminophen (ROXICET) 5-325 MG per tablet   Oral   Take 1 tablet by mouth every 6 (six) hours as needed.   20 tablet   0   . tamoxifen (NOLVADEX) 20 MG tablet   Oral   Take 1 tablet (20 mg total) by mouth daily.   30 tablet   0     Allergies Aleve  History reviewed. No pertinent family history.  Social History Social History  Substance Use Topics  . Smoking status: Former Smoker -- 2 years    Types: Cigarettes  . Smokeless tobacco: Never Used     Comment: 1-2 cigarettes a day  . Alcohol Use: No    Review of Systems Constitutional: No fever/chills Eyes: No visual changes. ENT: No sore throat. Cardiovascular: Denies chest pain. Respiratory: Denies shortness of breath. Gastrointestinal: No abdominal pain.  No nausea, no vomiting.  No diarrhea.  No constipation. Genitourinary: Negative for dysuria. Musculoskeletal: Negative for back pain. Skin: Negative for rash. Healing wound to right face and right upper arm  Neurological: Negative for headaches, focal weakness or numbness.  10-point ROS otherwise negative.  ____________________________________________   PHYSICAL EXAM:  VITAL SIGNS: ED  Triage Vitals  Enc Vitals Group     BP 04/09/15 1525 140/81 mmHg     Pulse Rate 04/09/15 1525 85     Resp 04/09/15 1525 16     Temp 04/09/15 1525 98.3 F (36.8 C)     Temp Source 04/09/15 1525 Oral     SpO2 04/09/15 1525 96 %     Weight --      Height --      Head Cir --      Peak Flow --      Pain Score 04/09/15 1546 5     Pain Loc --      Pain Edu? --      Excl. in [place]? --     Constitutional: Alert and oriented. Well appearing and in no acute distress. Eyes: Conjunctivae are normal. PERRL. EOMI. Head: Atraumatic. See skin note below.   Ears: no canal or TM erythema, normal TMs  bilaterally.   Nose: No congestion/rhinnorhea.  Mouth/Throat: Mucous membranes are moist.  Oropharynx non-erythematous. Neck: No stridor.  No cervical spine tenderness to palpation. Hematological/Lymphatic/Immunilogical: No cervical lymphadenopathy. Cardiovascular: Normal rate, regular rhythm. Grossly normal heart sounds.  Good peripheral circulation. Respiratory: Normal respiratory effort.  No retractions. Lungs CTAB. Gastrointestinal: Soft and nontender. No distention. Normal Bowel sounds.   Musculoskeletal: No lower or upper extremity tenderness nor edema.  No joint effusions. Bilateral pedal pulses equal and easily palpated. No cervical, thoracic or lumbar tenderness to palpation. See skin notes below. Neurologic:  Normal speech and language. No gross focal neurologic deficits are appreciated. No gait instability. Skin:  Skin is warm, dry and intact. No rash noted. Except: Right upper arm superficial abrasions and mild ecchymosis. Full range of motion and nontender. Bilateral handgrips equal. Right face and right preauricular healing lacerations with mild ecchymosis. Minimal swelling. Nontender. No signs of infection. No erythema or drainage. Right lateral face approximately 25 running sutures present. Right ear approximately 30 sutures present. Nontender. Psychiatric: Mood and affect are normal. Speech and behavior are normal.  ____________________________________________   LABS (all labs ordered are listed, but only abnormal results are displayed)  Labs Reviewed - No data to display ____________________________________________ __________________________________________   INITIAL IMPRESSION / ASSESSMENT AND PLAN / ED COURSE  Pertinent labs & imaging results that were available during my care of the patient were reviewed by me and considered in my medical decision making (see chart for details).  Very well-appearing patient. Presents with daughter at bedside for the complaint of  wound check. Patient reports on August 11 of this month she presented to the ER post dog bite. Patient reports that it was her dog. Patient states that she had approximately 60 stitches placed and here for wound check. Dr. Lenise Arena was the physician who initially assessed patient as well as place stitches on initial visit to the ER. Dr. Jimmye Norman in ER at current visit examined sutures and reports that sutures well-appearing and wounds healing well. Patient also seen and evaluated and examined by Dr. Jimmye Norman in ER. Discussed patient and plan of care with Dr Jimmye Norman.   Patient late coming to the ER for day 3 rabies immunization by 2 days. We'll provide second rabies immunization today. Patient directed to return to the ER in 4 more days for next rabies immunization then one week post. Sutures not yet ready to be removed. We'll evaluate sutures at next visit. Patient to continue home antibiotics. Continued cleaning at home. Wound care discussed. Strict follow-up and return parameters given.  Patient verbalized understanding and agreed to plan.   ____________________________________________   FINAL CLINICAL IMPRESSION(S) / ED DIAGNOSES  Final diagnoses:  Visit for wound check  Need for prophylactic vaccination against rabies  Animal bite follow up     Marylene Land, NP 04/09/15 [place], MD 04/10/15 [phone]

## 2015-04-09 NOTE — Discharge Instructions (Signed)
Take medication as prescribed. Continue antibiotics. Keep clean.   Return to ER on 8/20 and 04/20/2015 for suture removal and next rabies immunization.   REturn to ER sooner for increased pain, swelling, drainage, new or worsening concerns.   Animal Bite An animal bite can result in a scratch on the skin, deep open cut, puncture of the skin, crush injury, or tearing away of the skin or a body part. Dogs are responsible for most animal bites. Children are bitten more often than adults. An animal bite can range from very mild to more serious. A small bite from your house pet is no cause for alarm. However, some animal bites can become infected or injure a bone or other tissue. You must seek medical care if:  The skin is broken and bleeding does not slow down or stop after 15 minutes.  The puncture is deep and difficult to clean (such as a cat bite).  Pain, warmth, redness, or pus develops around the wound.  The bite is from a stray animal or rodent. There may be a risk of rabies infection.  The bite is from a snake, raccoon, skunk, fox, coyote, or bat. There may be a risk of rabies infection.  The person bitten has a chronic illness such as diabetes, liver disease, or cancer, or the person takes medicine that lowers the immune system.  There is concern about the location and severity of the bite. It is important to clean and protect an animal bite wound right away to prevent infection. Follow these steps:  Clean the wound with plenty of water and soap.  Apply an antibiotic cream.  Apply gentle pressure over the wound with a clean towel or gauze to slow or stop bleeding.  Elevate the affected area above the heart to help stop any bleeding.  Seek medical care. Getting medical care within 8 hours of the animal bite leads to the best possible outcome. DIAGNOSIS  Your caregiver will most likely:  Take a detailed history of the animal and the bite injury.  Perform a wound exam.  Take  your medical history. Blood tests or X-rays may be performed. Sometimes, infected bite wounds are cultured and sent to a lab to identify the infectious bacteria.  TREATMENT  Medical treatment will depend on the location and type of animal bite as well as the patient's medical history. Treatment may include:  Wound care, such as cleaning and flushing the wound with saline solution, bandaging, and elevating the affected area.  Antibiotics.  Tetanus immunization.  Rabies immunization.  Leaving the wound open to heal. This is often done with animal bites, due to the high risk of infection. However, in certain cases, wound closure with stitches, wound adhesive, skin adhesive strips, or staples may be used. Infected bites that are left untreated may require intravenous (IV) antibiotics and surgical treatment in the hospital. [place]  Follow your caregiver's instructions for wound care.  Take all medicines as directed.  If your caregiver prescribes antibiotics, take them as directed. Finish them even if you start to feel better.  Follow up with your caregiver for further exams or immunizations as directed. You may need a tetanus shot if:  You cannot remember when you had your last tetanus shot.  You have never had a tetanus shot.  The injury broke your skin. If you get a tetanus shot, your arm may swell, get red, and feel warm to the touch. This is common and not a problem. If you  need a tetanus shot and you choose not to have one, there is a rare chance of getting tetanus. Sickness from tetanus can be serious. SEEK MEDICAL CARE IF:  You notice warmth, redness, soreness, swelling, pus discharge, or a bad smell coming from the wound.  You have a red line on the skin coming from the wound.  You have a fever, chills, or a general ill feeling.  You have nausea or vomiting.  You have continued or worsening pain.  You have trouble moving the injured part.  You have  other questions or concerns. MAKE SURE YOU:  Understand these instructions.  Will watch your condition.  Will get help right away if you are not doing well or get worse. Document Released: 04/28/2011 Document Revised: 11/02/2011 Document Reviewed: 04/28/2011 [facility] Patient Information 2015 [place], [place]. This information is not intended to replace advice given to you by your health care provider. Make sure you discuss any questions you have with your health care provider.  Rabies  Rabies is a viral infection that can be spread to people from infected animals. The infection affects the brain and central nervous system. Once the disease develops, it almost always causes death. Because of this, when a person is bitten by an animal that may have rabies, treatment to prevent rabies often needs to be started whether or not the animal is known to be infected. Prompt treatment with the rabies vaccine and rabies immune globulin is very effective at preventing the infection from developing in people who have been exposed to the rabies virus. CAUSES  Rabies is caused by a virus that lives inside some animals. When a person is bitten by an infected animal, the rabies virus is spread to the person through the infected spit (saliva) of the animal. This virus can be carried by animals such as dogs, cats, skunks, bats, woodchucks, raccoons, coyotes, and foxes. SYMPTOMS  By the time symptoms appear, rabies is usually fatal for the person. Common symptoms include:  Headache.  Fever.  Fatigue and weakness.  Agitation.  Anxiety.  Confusion.  Unusual behavior, such as hyperactivity, fear of water (hydrophobia), or fear of air (aerophobia).  Hallucinations.  Insomnia.  Weakness in the arms or legs.  Difficulty swallowing. Most people get sick in 1-3 months after being bitten. This often varies and may depend on the location of the bite. The infection will take less time to develop if the bite  occurred closer to the head.  DIAGNOSIS  To determine if a person is infected, several tests must be performed, such as:  A skin biopsy.  A saliva test.  A lumbar puncture to remove spinal fluid so it can be examined.  Blood tests. TREATMENT  Treatment to prevent the infection from developing (post-exposure prophylaxis, PEP) is often started before knowing for sure if the person has been exposed to the rabies virus. PEP involves cleaning the wound, giving an antibody injection (rabies immune globulin), and giving a series of rabies vaccine injections. The series of injections are usually given over a two-week period. If possible, the animal that bit the person will be observed to see if it remains healthy. If the animal has been killed, it can be sent to a state laboratory and examined to see if the animal had rabies. If a person is bitten by a domestic animal (dog, cat, or ferret) that appears healthy and can be observed to see if it remains healthy, often no further treatment is necessary other than care of  the wounds caused by the animal. Rabies is often a fatal illness once the infection develops in a person. Although a few people who developed rabies have survived after experimental treatment with certain drugs, all these survivors still had severe nervous system problems after the treatment. This is why caregivers use extra caution and begin PEP treatment for people who have been bitten by animals that are possibly infected with rabies.  HOME CARE INSTRUCTIONS  If you were bitten by an unknown animal, make sure you know your caregiver's instructions for follow-up. If the animal was sent to a laboratory for examination, ask when the test results will be ready. Make sure you get the test results.  Take these steps to care for your wound:  Keep the wound clean, dry, and dressed as directed by your caregiver.  Keep the injured part elevated as much as possible.  Do not resume use of the  affected area until directed.  Only take over-the-counter or prescription medicines as directed by your caregiver.  Keep all follow-up appointments as directed by your caregiver. PREVENTION  To prevent rabies, people need to reduce their risk of having contact with infected animals.   Make sure your pets (dogs, cats, ferrets) are vaccinated against rabies. Keep these vaccinations up-to-date as directed by your veterinarian.  Supervise your pets when they are outside. Keep them away from wild animals.  Call your local animal control services to report any stray animals. These animals may not be vaccinated.  Stay away from stray or wild animals.  Consider getting the rabies vaccine (preexposure) if you are traveling to an area where rabies is common or if your job or activities involve possible contact with wild or stray animals. Discuss this with your caregiver. Document Released: 08/10/2005 Document Revised: 05/04/2012 Document Reviewed: 03/08/2012 [facility] Patient Information 2015 [place], [place]. This information is not intended to replace advice given to you by your health care provider. Make sure you discuss any questions you have with your health care provider.  Wound Check Your wound appears healthy today. Your wound will heal gradually over time. Eventually a scar will form that will fade with time. FACTORS THAT AFFECT SCAR FORMATION:  People differ in the severity in which they scar.  Scar severity varies according to location, size, and the traits you inherited from your parents (genetic predisposition).  Irritation to the wound from infection, rubbing, or chemical exposure will increase the amount of scar formation. HOME CARE INSTRUCTIONS   If you were given a dressing, you should change it at least once a day or as instructed by your caregiver. If the bandage sticks, soak it off with a solution of hydrogen peroxide.  If the bandage becomes wet, dirty, or develops a bad  smell, change it as soon as possible.  Look for signs of infection.  Only take over-the-counter or prescription medicines for pain, discomfort, or fever as directed by your caregiver. SEEK IMMEDIATE MEDICAL CARE IF:   You have redness, swelling, or increasing pain in the wound.  You notice pus coming from the wound.  You have a fever.  You notice a bad smell coming from the wound or dressing. Document Released: 05/16/2004 Document Revised: 11/02/2011 Document Reviewed: 08/10/2005 [facility] Patient Information 2015 [place], [place]. This information is not intended to replace advice given to you by your health care provider. Make sure you discuss any questions you have with your health care provider.

## 2015-04-15 ENCOUNTER — Encounter: Payer: Self-pay | Admitting: Emergency Medicine

## 2015-04-15 ENCOUNTER — Emergency Department
Admission: EM | Admit: 2015-04-15 | Discharge: 2015-04-15 | Disposition: A | Discharge status: Home / Self Care | Payer: Medicaid Other | Attending: Emergency Medicine | Admitting: Emergency Medicine

## 2015-04-15 DIAGNOSIS — Z4802 Encounter for removal of sutures: Secondary | ICD-10-CM | POA: Insufficient documentation

## 2015-04-15 DIAGNOSIS — Z87891 Personal history of nicotine dependence: Secondary | ICD-10-CM | POA: Diagnosis not present

## 2015-04-15 DIAGNOSIS — Z792 Long term (current) use of antibiotics: Secondary | ICD-10-CM | POA: Insufficient documentation

## 2015-04-15 DIAGNOSIS — Z23 Encounter for immunization: Secondary | ICD-10-CM | POA: Diagnosis present

## 2015-04-15 MED ORDER — IBUPROFEN 800 MG PO TABS
800.0000 mg | ORAL_TABLET | Freq: Once | ORAL | Status: AC
  Administered 2015-04-15: 800 mg via ORAL
  Filled 2015-04-15: qty 1

## 2015-04-15 MED ORDER — BACITRACIN ZINC 500 UNIT/GM EX OINT
TOPICAL_OINTMENT | CUTANEOUS | Status: AC
  Filled 2015-04-15: qty 1.8

## 2015-04-15 MED ORDER — NEOMYCIN-POLYMYXIN-PRAMOXINE 1 % EX CREA: TOPICAL_CREAM | Freq: Two times a day (BID) | CUTANEOUS | Status: DC

## 2015-04-15 NOTE — ED Notes (Signed)
Here for rabies vaccine

## 2015-04-15 NOTE — ED Notes (Signed)
Pt discharged home after verbalizing understanding of discharge instructions; nad noted. 

## 2015-04-15 NOTE — ED Notes (Signed)
Pt to return in  2 days. Ear is scabbed but not completely healed.

## 2015-04-15 NOTE — Discharge Instructions (Signed)

## 2015-04-15 NOTE — ED Provider Notes (Signed)
[facility] Emergency Department Provider Note  ____________________________________________  Time seen: Approximately 3:47 PM  I have reviewed the triage vital signs and the nursing notes.   HISTORY  Chief Complaint Rabies Injection    HPI Sonya Perry is a 49 y.o. female patient reports today for her to what would've been her third rabies shot. Patient was scheduled to have an injection 3 days ago did not make it. Patient state yesterday her pet dog return back to control clear rabies a 10 day quarantine. Patient also here for suture removal which was due to be taken out yesterday. Patient stated the wound has healed but she is anxious about the suture removal. Patient states she has finished antibodies. Past Medical History  Diagnosis Date  . Cancer 2011    left breast  . Personal history of tobacco use, presenting hazards to health 2013  . Obesity, unspecified 2013  . 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 Breast cancer 2011    left breast, chemo and radiation    Patient Active Problem List   Diagnosis Date Noted  . History of breast cancer 05/02/2013  . Cancer   . 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 Cesarean section  1989  . Portacath placement  2011  . Breast surgery Left 2011    lumpectomy radiation and chemo.  . Breast excisional biopsy Left 2011    positive    Current Outpatient Rx  Name  Route  Sig  Dispense  Refill  . clindamycin (CLEOCIN) 300 MG capsule   Oral   Take 1 capsule (300 mg total) by mouth 3 (three) times daily.   30 capsule   0   . mupirocin ointment (BACTROBAN) 2 %      Apply to affected area 3 times daily   22 g   0   . oxyCODONE-acetaminophen (ROXICET) 5-325 MG per tablet   Oral   Take 1 tablet by mouth every 6 (six) hours as needed.   20 tablet   0   . tamoxifen  (NOLVADEX) 20 MG tablet   Oral   Take 1 tablet (20 mg total) by mouth daily.   30 tablet   0     Allergies Aleve  No family history on file.  Social History Social History  Substance Use Topics  . Smoking status: Former Smoker -- 2 years    Types: Cigarettes  . Smokeless tobacco: Never Used     Comment: 1-2 cigarettes a day  . Alcohol Use: No    Review of Systems Constitutional: No fever/chills Eyes: No visual changes. ENT: No sore throat. Cardiovascular: Denies chest pain. Respiratory: Denies shortness of breath. Gastrointestinal: No abdominal pain.  No nausea, no vomiting.  No diarrhea.  No constipation. Genitourinary: Negative for dysuria. Musculoskeletal: Negative for back pain. Skin: Facial and right ear laceration  Neurological: Negative for headaches, focal weakness or numbness. 10-point ROS otherwise negative.  ____________________________________________   PHYSICAL EXAM:  VITAL SIGNS: ED Triage Vitals  Enc Vitals Group     BP 04/15/15 1534 147/88 mmHg     Pulse Rate 04/15/15 1534 88     Resp 04/15/15 1534 20     Temp 04/15/15 1534 98.6 F (37 C)     Temp Source 04/15/15 1534 Oral     SpO2 04/15/15 1534 98 %  Weight 04/15/15 1535 150 lb (68.04 kg)     Height 04/15/15 1535 5\' 4"  (1.626 m)     Head Cir --      Peak Flow --      Pain Score --      Pain Loc --      Pain Edu? --      Excl. in [place]? --     Constitutional: Alert and oriented. Well appearing and in no acute distress. Eyes: Conjunctivae are normal. PERRL. EOMI. Head: Atraumatic. Nose: No congestion/rhinnorhea. Mouth/Throat: Mucous membranes are moist.  Oropharynx non-erythematous. Neck: No stridor.  No cervical spine tenderness to palpation. Hematological/Lymphatic/Immunilogical: No cervical lymphadenopathy. Cardiovascular: Normal rate, regular rhythm. Grossly normal heart sounds.  Good peripheral circulation. Respiratory: Normal respiratory effort.  No retractions. Lungs  CTAB. Gastrointestinal: Soft and nontender. No distention. No abdominal bruits. No CVA tenderness. Musculoskeletal: No lower extremity tenderness nor edema.  No joint effusions. Neurologic:  Normal speech and language. No gross focal neurologic deficits are appreciated. No gait instability. Skin:  Skin is warm, dry and intact. No rash noted. Healed right lateral facial laceration. Psychiatric: Mood and affect are normal. Speech and behavior are normal.  ____________________________________________   LABS (all labs ordered are listed, but only abnormal results are displayed)  Labs Reviewed - No data to display ____________________________________________  EKG   ____________________________________________  RADIOLOGY   ____________________________________________   PROCEDURES  Procedure(s) performed: None  Critical Care performed: No  ____________________________________________   INITIAL IMPRESSION / ASSESSMENT AND PLAN / ED COURSE  Pertinent labs & imaging results that were available during my care of the patient were reviewed by me and considered in my medical decision making (see chart for details).  He'll facial laceration. Sutures will be removed. Based upon animal control released the dog is a ten-day quarantine no further need for rabies prophylactic. Patient advised follow-up family doctor. ____________________________________________   FINAL CLINICAL IMPRESSION(S) / ED DIAGNOSES  Final diagnoses:  Encounter for removal of sutures      Sable Feil, PA-C 04/15/15 1614  Nance Pear, MD 04/15/15 1623

## 2015-04-15 NOTE — ED Notes (Signed)
Here for rabies vaccine  Was unable to come on sat. Suture line intact

## 2015-04-17 ENCOUNTER — Emergency Department
Admission: EM | Admit: 2015-04-17 | Discharge: 2015-04-17 | Disposition: A | Discharge status: Home / Self Care | Payer: Medicaid Other | Attending: Emergency Medicine | Admitting: Emergency Medicine

## 2015-04-17 ENCOUNTER — Encounter: Payer: Self-pay | Admitting: *Deleted

## 2015-04-17 ENCOUNTER — Ambulatory Visit: Admission: RE | Admit: 2015-04-17 | Payer: Medicaid Other | Source: Ambulatory Visit

## 2015-04-17 ENCOUNTER — Other Ambulatory Visit: Payer: Self-pay

## 2015-04-17 DIAGNOSIS — Z792 Long term (current) use of antibiotics: Secondary | ICD-10-CM | POA: Diagnosis not present

## 2015-04-17 DIAGNOSIS — Z4801 Encounter for change or removal of surgical wound dressing: Secondary | ICD-10-CM | POA: Insufficient documentation

## 2015-04-17 DIAGNOSIS — Z79899 Other long term (current) drug therapy: Secondary | ICD-10-CM | POA: Diagnosis not present

## 2015-04-17 DIAGNOSIS — Z87891 Personal history of nicotine dependence: Secondary | ICD-10-CM | POA: Insufficient documentation

## 2015-04-17 DIAGNOSIS — IMO0002 Reserved for concepts with insufficient information to code with codable children: Secondary | ICD-10-CM

## 2015-04-17 NOTE — ED Provider Notes (Signed)
[facility] Emergency Department Provider Note  ____________________________________________  Time seen: Approximately 2:24 PM  I have reviewed the triage vital signs and the nursing notes.   HISTORY  Chief Complaint Wound Check   HPI Sonya Perry is a 49 y.o. female is here for follow-up after having her sutures removed from her face on 8/22. Patient states she's been using some Neosporin to this scab on her right ear. She denies any fever or chills. She denies any discharge from her ear. She did finish the complete course of Augmentin from her initial visit from her dog bite.Currently her pain scale is 2 out of 10.   Past Medical History  Diagnosis Date  . Cancer 2011    left breast  . Personal history of tobacco use, presenting hazards to health 2013  . Obesity, unspecified 2013  . 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 Breast cancer 2011    left breast, chemo and radiation    Patient Active Problem List   Diagnosis Date Noted  . History of breast cancer 05/02/2013  . Cancer   . 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 Cesarean section  1989  . Portacath placement  2011  . Breast surgery Left 2011    lumpectomy radiation and chemo.  . Breast excisional biopsy Left 2011    positive    Current Outpatient Rx  Name  Route  Sig  Dispense  Refill  . clindamycin (CLEOCIN) 300 MG capsule   Oral   Take 1 capsule (300 mg total) by mouth 3 (three) times daily.   30 capsule   0   . mupirocin ointment (BACTROBAN) 2 %      Apply to affected area 3 times daily   22 g   0   . neomycin-polymyxin-pramoxine (NEOSPORIN PLUS) 1 % cream   Topical   Apply topically 2 (two) times daily.   14.2 g   0   . oxyCODONE-acetaminophen (ROXICET) 5-325 MG per tablet   Oral   Take 1 tablet by mouth every 6 (six) hours as  needed.   20 tablet   0   . tamoxifen (NOLVADEX) 20 MG tablet   Oral   Take 1 tablet (20 mg total) by mouth daily.   30 tablet   0     Allergies Aleve  No family history on file.  Social History Social History  Substance Use Topics  . Smoking status: Former Smoker -- 2 years    Types: Cigarettes  . Smokeless tobacco: Never Used     Comment: 1-2 cigarettes a day  . Alcohol Use: No    Review of Systems Constitutional: No fever/chills 10-point ROS otherwise negative.  ____________________________________________   PHYSICAL EXAM:  VITAL SIGNS: ED Triage Vitals  Enc Vitals Group     BP 04/17/15 1321 134/86 mmHg     Pulse Rate 04/17/15 1321 80     Resp --      Temp 04/17/15 1321 98.6 F (37 C)     Temp Source 04/17/15 1321 Oral     SpO2 04/17/15 1321 98 %     Weight 04/17/15 1321 150 lb (68.04 kg)     Height 04/17/15 1321 5\' 5"  (1.651 m)     Head Cir --      Peak Flow --      Pain Score  04/17/15 1321 2     Pain Loc --      Pain Edu? --      Excl. in [place]? --     Constitutional: Alert and oriented. Well appearing and in no acute distress. Eyes: Conjunctivae are normal. PERRL. EOMI. Head: Atraumatic. Nose: No congestion/rhinnorhea. Mouth/Throat: Mucous membranes are moist.  Oropharynx non-erythematous. Neck: No stridor.   Cardiovascular: Normal rate, regular rhythm. Grossly normal heart sounds.  Good peripheral circulation. Respiratory: Normal respiratory effort.  No retractions. Lungs CTAB. Gastrointestinal: Soft and nontender. No distention. Musculoskeletal: No lower extremity tenderness nor edema.  No joint effusions. Neurologic:  Normal speech and language. No gross focal neurologic deficits are appreciated. No gait instability. Skin:  Skin is warm, dry. There is a scab formation on the right earlobe but no erythema or drainage from this area. Facial laceration repair is healed without any signs of infection. Psychiatric: Mood and affect are normal.  Speech and behavior are normal.  ____________________________________________   LABS (all labs ordered are listed, but only abnormal results are displayed)  Labs Reviewed - No data to display ____________________________________________  PROCEDURES  Procedure(s) performed: None  Critical Care performed: No  ____________________________________________   INITIAL IMPRESSION / ASSESSMENT AND PLAN / ED COURSE  Pertinent labs & imaging results that were available during my care of the patient were reviewed by me and considered in my medical decision making (see chart for details).  Patient was told to continue using Neosporin to the scab area. Should she develop any fever, drainage or urgent concerns she is to return to the emergency room. ____________________________________________   FINAL CLINICAL IMPRESSION(S) / ED DIAGNOSES  Final diagnoses:  Encounter for re-check of laceration wound      Johnn Hai, PA-C 04/17/15 1456  Carrie Mew, MD 04/18/15 [phone]

## 2015-04-17 NOTE — ED Notes (Signed)
Pt here for follow up on suture removal from previous dog bite to face and right ear.  No s/s of infection of wound.

## 2015-04-17 NOTE — ED Notes (Signed)
Pt here for follow up of suture removal on right face on Monday

## 2015-04-18 ENCOUNTER — Encounter: Payer: Self-pay | Admitting: General Surgery

## 2015-04-18 ENCOUNTER — Ambulatory Visit (INDEPENDENT_AMBULATORY_CARE_PROVIDER_SITE_OTHER): Payer: Medicaid Other | Admitting: General Surgery

## 2015-04-18 ENCOUNTER — Inpatient Hospital Stay: Payer: Medicaid Other

## 2015-04-18 VITALS — BP 128/68 | HR 70 | Resp 12 | Ht 62.0 in | Wt 153.0 lb

## 2015-04-18 DIAGNOSIS — C50912 Malignant neoplasm of unspecified site of left female breast: Secondary | ICD-10-CM

## 2015-04-18 NOTE — Progress Notes (Signed)
Patient ID: Sonya Perry, female   DOB: [birth year], 49 y.o.   MRN: [id number]  Chief Complaint  Patient presents with  . Follow-up    HPI Sonya Perry is a 49 y.o. female.  who presents for a breast evaluation. The most recent mammogram was done on 04-05-15.  Patient does perform regular self breast checks and gets regular mammograms done. No new breast issues. She was bitten by her dog on 04-04-15 in the arm and face-had repair of multiple lacerations-doing well now.  HPI  Past Medical History  Diagnosis Date  . Cancer 2011    left breast  . Personal history of tobacco use, presenting hazards to health 2013  . Obesity, unspecified 2013  . 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 Breast cancer 2011    left breast, chemo and radiation  . Dog bite of face 04-04-15    Past Surgical History  Procedure Laterality Date  . Cesarean section  1989  . Portacath placement  2011  . Breast surgery Left 2011    lumpectomy radiation and chemo.  . Breast excisional biopsy Left 2011    positive    No family history on file.  Social History Social History  Substance Use Topics  . Smoking status: Former Smoker -- 2 years    Types: Cigarettes  . Smokeless tobacco: Never Used     Comment: 1-2 cigarettes a day  . Alcohol Use: No    Allergies  Allergen Reactions  . Aleve [Naproxen Sodium] Other (See Comments)    Stomach pain/gas pains/upset stomach    Current Outpatient Prescriptions  Medication Sig Dispense Refill  . mupirocin ointment (BACTROBAN) 2 % Apply to affected area 3 times daily 22 g 0  . neomycin-polymyxin-pramoxine (NEOSPORIN PLUS) 1 % cream Apply topically 2 (two) times daily. 14.2 g 0  . tamoxifen (NOLVADEX) 20 MG tablet Take 1 tablet (20 mg total) by mouth daily. 30 tablet 0   No current facility-administered medications for this visit.    Review of Systems Review of Systems  Constitutional: Negative.    Respiratory: Negative.   Cardiovascular: Negative.     Blood pressure 128/68, pulse 70, resp. rate 12, height 5\' 2"  (1.575 m), weight 153 lb (69.4 kg).  Physical Exam Physical Exam  Constitutional: She is oriented to person, place, and time. She appears well-developed and well-nourished.  HENT:  Mouth/Throat: Oropharynx is clear and moist.  Eyes: Conjunctivae are normal. No scleral icterus.  Neck: Neck supple.  Cardiovascular: Normal rate, regular rhythm and normal heart sounds.   Pulmonary/Chest: Effort normal and breath sounds normal. Right breast exhibits no inverted nipple, no mass, no nipple discharge, no skin change and no tenderness. Left breast exhibits no inverted nipple, no mass, no nipple discharge, no skin change and no tenderness.  Abdominal: Soft. There is no tenderness.  Lymphadenopathy:    She has no cervical adenopathy.    She has no axillary adenopathy.  Neurological: She is alert and oriented to person, place, and time.  Skin: Skin is warm and dry.  Healing lacerations right side of face and right ear lobe. Several lacerations to right upper arm.  Psychiatric: Her behavior is normal.    Data Reviewed Mammogram reviewed and stable.  Assessment    Stable exam. Patient 5 years out from left breast lumpectomy, chemo, radiation for invasive cancer T1,N0,M0.Tolerating Tamoxifen.    Plan    The patient has been asked to  return to the office in one year with a bilateral diagnostic mammogram.      PCP:  No Pcp   Carson Myrtle 04/18/2015, 3:47 PM

## 2015-04-18 NOTE — Patient Instructions (Addendum)
The patient has been asked to return to the office in one year with a bilateral diagnostic mammogram.Continue self breast exams. Call office for any new breast issues or concerns.  

## 2015-04-19 ENCOUNTER — Encounter: Payer: Self-pay | Admitting: General Surgery

## 2015-05-06 ENCOUNTER — Encounter: Payer: Self-pay | Admitting: Emergency Medicine

## 2015-05-06 ENCOUNTER — Emergency Department
Admission: EM | Admit: 2015-05-06 | Discharge: 2015-05-06 | Disposition: A | Discharge status: Home / Self Care | Payer: Medicaid Other | Attending: Emergency Medicine | Admitting: Emergency Medicine

## 2015-05-06 DIAGNOSIS — Z4801 Encounter for change or removal of surgical wound dressing: Secondary | ICD-10-CM | POA: Insufficient documentation

## 2015-05-06 DIAGNOSIS — K088 Other specified disorders of teeth and supporting structures: Secondary | ICD-10-CM | POA: Insufficient documentation

## 2015-05-06 DIAGNOSIS — Z5189 Encounter for other specified aftercare: Secondary | ICD-10-CM

## 2015-05-06 DIAGNOSIS — K029 Dental caries, unspecified: Secondary | ICD-10-CM | POA: Diagnosis not present

## 2015-05-06 DIAGNOSIS — Z792 Long term (current) use of antibiotics: Secondary | ICD-10-CM | POA: Insufficient documentation

## 2015-05-06 DIAGNOSIS — K0889 Other specified disorders of teeth and supporting structures: Secondary | ICD-10-CM

## 2015-05-06 DIAGNOSIS — Z87891 Personal history of nicotine dependence: Secondary | ICD-10-CM | POA: Insufficient documentation

## 2015-05-06 DIAGNOSIS — Z79899 Other long term (current) drug therapy: Secondary | ICD-10-CM | POA: Insufficient documentation

## 2015-05-06 DIAGNOSIS — R51 Headache: Secondary | ICD-10-CM | POA: Diagnosis present

## 2015-05-06 MED ORDER — PENICILLIN V POTASSIUM 500 MG PO TABS: 500.0000 mg | ORAL_TABLET | Freq: Four times a day (QID) | ORAL | Status: DC

## 2015-05-06 MED ORDER — PENICILLIN V POTASSIUM 500 MG PO TABS
500.0000 mg | ORAL_TABLET | Freq: Once | ORAL | Status: AC
  Administered 2015-05-06: 500 mg via ORAL
  Filled 2015-05-06: qty 1

## 2015-05-06 NOTE — ED Notes (Signed)
Pt states she was bit by a dog on August 11th on the right side of her face, since the bite pt has pain on the right side and states she is unable to open her mouth all the way, pt states she is able to eat and drink with no problems

## 2015-05-06 NOTE — Discharge Instructions (Signed)
Dental Pain Toothache is pain in or around a tooth. It may get worse with chewing or with cold or heat.  HOME CARE  Your dentist may use a numbing medicine during treatment. If so, you may need to avoid eating until the medicine wears off. Ask your dentist about this.  Only take medicine as told by your dentist or doctor.  Avoid chewing food near the painful tooth until after all treatment is done. Ask your dentist about this. GET HELP RIGHT AWAY IF:   The problem gets worse or new problems appear.  You have a fever.  There is redness and puffiness (swelling) of the face, jaw, or neck.  You cannot open your mouth.  There is pain in the jaw.  There is very bad pain that is not helped by medicine. MAKE SURE YOU:   Understand these instructions.  Will watch your condition.  Will get help right away if you are not doing well or get worse. Document Released: 01/27/2008 Document Revised: 11/02/2011 Document Reviewed: 01/27/2008 [facility] Patient Information 2015 [place], [place]. This information is not intended to replace advice given to you by your health care provider. Make sure you discuss any questions you have with your health care provider.  Wound Care Wound care helps prevent pain and infection.  You may need a tetanus shot if:  You cannot remember when you had your last tetanus shot.  You have never had a tetanus shot.  The injury broke your skin. If you need a tetanus shot and you choose not to have one, you may get tetanus. Sickness from tetanus can be serious. HOME CARE   Only take medicine as told by your doctor.  Clean the wound daily with mild soap and water.  Change any bandages (dressings) as told by your doctor.  Put medicated cream and a bandage on the wound as told by your doctor.  Change the bandage if it gets wet, dirty, or starts to smell.  Take showers. Do not take baths, swim, or do anything that puts your wound under water.  Rest and raise  (elevate) the wound until the pain and puffiness (swelling) are better.  Keep all doctor visits as told. GET HELP RIGHT AWAY IF:   Yellowish-white fluid (pus) comes from the wound.  Medicine does not lessen your pain.  There is a red streak going away from the wound.  You have a fever. MAKE SURE YOU:   Understand these instructions.  Will watch your condition.  Will get help right away if you are not doing well or get worse. Document Released: 05/19/2008 Document Revised: 11/02/2011 Document Reviewed: 12/14/2010 [facility] Patient Information 2015 [place], [place]. This information is not intended to replace advice given to you by your health care provider. Make sure you discuss any questions you have with your health care provider.

## 2015-05-06 NOTE — ED Provider Notes (Signed)
[facility] Emergency Department Provider Note  ____________________________________________  Time seen: Approximately 063 PM  I have reviewed the triage vital signs and the nursing notes.   HISTORY  Chief Complaint Facial Pain    HPI Sonya Perry is a 49 y.o. female who suffered a dog bite to her face on 04/04/2015 who is presenting to the emergency department for facial pain. She says that the pain is increased over the past 2-3 weeks. She says that the majority of the pain is to the back of her mouth and also to her molars. She denies any fevers. Completed a course of Augmentin after the initial injury. Denies any pus drainage. Admits to pain with opening and closing of her jaw.   Past Medical History  Diagnosis Date  . Cancer 2011    left breast  . Personal history of tobacco use, presenting hazards to health 2013  . Obesity, unspecified 2013  . 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 Breast cancer 2011    left breast, chemo and radiation  . Dog bite of face 04-04-15    Patient Active Problem List   Diagnosis Date Noted  . History of breast cancer 05/02/2013  . Cancer   . 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 Cesarean section  1989  . Portacath placement  2011  . Breast surgery Left 2011    lumpectomy radiation and chemo.  . Breast excisional biopsy Left 2011    positive    Current Outpatient Rx  Name  Route  Sig  Dispense  Refill  . mupirocin ointment (BACTROBAN) 2 %      Apply to affected area 3 times daily   22 g   0   . neomycin-polymyxin-pramoxine (NEOSPORIN PLUS) 1 % cream   Topical   Apply topically 2 (two) times daily.   14.2 g   0   . tamoxifen (NOLVADEX) 20 MG tablet   Oral   Take 1 tablet (20 mg total) by mouth daily.   30 tablet   0     Allergies Aleve  History reviewed. No  pertinent family history.  Social History Social History  Substance Use Topics  . Smoking status: Former Smoker -- 2 years    Types: Cigarettes  . Smokeless tobacco: Never Used     Comment: 1-2 cigarettes a day  . Alcohol Use: No    Review of Systems Constitutional: No fever/chills Eyes: No visual changes. ENT: No sore throat. Cardiovascular: Denies chest pain. Respiratory: Denies shortness of breath. Gastrointestinal: No abdominal pain.  No nausea, no vomiting.  No diarrhea.  No constipation. Genitourinary: Negative for dysuria. Musculoskeletal: Negative for back pain. Skin: Negative for rash. Neurological: Negative for headaches, focal weakness or numbness.  10-point ROS otherwise negative.  ____________________________________________   PHYSICAL EXAM:  VITAL SIGNS: ED Triage Vitals  Enc Vitals Group     BP 05/06/15 1730 145/98 mmHg     Pulse Rate 05/06/15 1730 85     Resp 05/06/15 1730 18     Temp 05/06/15 1730 98.3 F (36.8 C)     Temp Source 05/06/15 1730 Oral     SpO2 05/06/15 1730 97 %     Weight 05/06/15 1730 155 lb (70.308 kg)     Height 05/06/15 1730 5\' 2"  (1.575 m)     Head Cir --  Peak Flow --      Pain Score 05/06/15 1731 6     Pain Loc --      Pain Edu? --      Excl. in [place]? --     Constitutional: Alert and oriented. Well appearing and in no acute distress. Eyes: Conjunctivae are normal. PERRL. EOMI. Head: Atraumatic. Nose: No congestion/rhinnorhea. Mouth/Throat: Mucous membranes are moist.  Oropharynx non-erythematous. Poor dentition throughout with multiple dental caries. No swelling or evidence of dental abscess. Neck: No stridor.   Cardiovascular: Normal rate, regular rhythm. Grossly normal heart sounds.  Good peripheral circulation. Respiratory: Normal respiratory effort.  No retractions. Lungs CTAB. Gastrointestinal: Soft and nontender. No distention. No abdominal bruits. No CVA tenderness. Musculoskeletal: No lower extremity  tenderness nor edema.  No joint effusions. Neurologic:  Normal speech and language. No gross focal neurologic deficits are appreciated. No gait instability. Skin:  Multiple well-healed areas of scarring to the right side of face without any dehiscence, erythema or pus. There is an area of mild erythema around the laceration repair to the right earlobe. There is no pus drainage or fluctuance. There is mild tenderness to palpation.  Psychiatric: Mood and affect are normal. Speech and behavior are normal.  ____________________________________________   LABS (all labs ordered are listed, but only abnormal results are displayed)  Labs Reviewed - No data to display ____________________________________________  EKG   ____________________________________________  RADIOLOGY   ____________________________________________   PROCEDURES  ____________________________________________   INITIAL IMPRESSION / ASSESSMENT AND PLAN / ED COURSE  Pertinent labs & imaging results that were available during my care of the patient were reviewed by me and considered in my medical decision making (see chart for details).  Impression is that the patient's pain is related to dental pain. She says that she will continue to use Neosporin to the healing laceration on the right ear. I will prescribe penicillin VK to treat her dental infection. ____________________________________________   FINAL CLINICAL IMPRESSION(S) / ED DIAGNOSES  Acute dental pain. Initial visit.    Orbie Pyo, MD 05/06/15 [phone]

## 2015-05-06 NOTE — ED Notes (Signed)
Reports dog bite to face a month ago.  Healing well but still having pain.

## 2015-09-11 ENCOUNTER — Inpatient Hospital Stay: Payer: Self-pay | Attending: Oncology | Admitting: Oncology

## 2015-09-11 ENCOUNTER — Inpatient Hospital Stay: Payer: Self-pay

## 2015-09-11 ENCOUNTER — Encounter: Payer: Self-pay | Admitting: Oncology

## 2015-09-11 VITALS — BP 130/90 | HR 109 | Temp 98.0°F | Resp 18 | Wt 149.3 lb

## 2015-09-11 DIAGNOSIS — Z9221 Personal history of antineoplastic chemotherapy: Secondary | ICD-10-CM | POA: Insufficient documentation

## 2015-09-11 DIAGNOSIS — Z853 Personal history of malignant neoplasm of breast: Secondary | ICD-10-CM | POA: Insufficient documentation

## 2015-09-11 DIAGNOSIS — R896 Abnormal cytological findings in specimens from other organs, systems and tissues: Secondary | ICD-10-CM | POA: Insufficient documentation

## 2015-09-11 DIAGNOSIS — C50412 Malignant neoplasm of upper-outer quadrant of left female breast: Secondary | ICD-10-CM

## 2015-09-11 DIAGNOSIS — Z87891 Personal history of nicotine dependence: Secondary | ICD-10-CM | POA: Insufficient documentation

## 2015-09-11 DIAGNOSIS — C50419 Malignant neoplasm of upper-outer quadrant of unspecified female breast: Secondary | ICD-10-CM

## 2015-09-11 DIAGNOSIS — E669 Obesity, unspecified: Secondary | ICD-10-CM | POA: Insufficient documentation

## 2015-09-11 LAB — COMPREHENSIVE METABOLIC PANEL
ALK PHOS: 54 U/L (ref 38–126)
ALT: 23 U/L (ref 14–54)
AST: 23 U/L (ref 15–41)
Albumin: 4.1 g/dL (ref 3.5–5.0)
Anion gap: 7 (ref 5–15)
BUN: 14 mg/dL (ref 6–20)
CO2: 24 mmol/L (ref 22–32)
CREATININE: 0.66 mg/dL (ref 0.44–1.00)
Calcium: 8.6 mg/dL — ABNORMAL LOW (ref 8.9–10.3)
Chloride: 100 mmol/L — ABNORMAL LOW (ref 101–111)
GFR calc Af Amer: >60 mL/min (ref >60)
GFR calc non Af Amer: >60 mL/min (ref >60)
Glucose, Bld: 126 mg/dL — ABNORMAL HIGH (ref 65–99)
Potassium: 3.6 mmol/L (ref 3.5–5.1)
SODIUM: 131 mmol/L — AB (ref 135–145)
Total Bilirubin: 1.4 mg/dL — ABNORMAL HIGH (ref 0.3–1.2)
Total Protein: 7.3 g/dL (ref 6.5–8.1)

## 2015-09-11 LAB — CBC WITH DIFFERENTIAL/PLATELET
BASOS ABS: 0 10*3/uL (ref 0–0.1)
BASOS PCT: 0 %
Eosinophils Absolute: 0 10*3/uL (ref 0–0.7)
Eosinophils Relative: 0 %
HEMATOCRIT: 41.8 % (ref 35.0–47.0)
HEMOGLOBIN: 14.4 g/dL (ref 12.0–16.0)
Lymphocytes Relative: 9 %
Lymphs Abs: 0.6 10*3/uL — ABNORMAL LOW (ref 1.0–3.6)
MCH: 31.9 pg (ref 26.0–34.0)
MCHC: 34.4 g/dL (ref 32.0–36.0)
MCV: 92.8 fL (ref 80.0–100.0)
MONOS PCT: 3 %
Monocytes Absolute: 0.2 10*3/uL (ref 0.2–0.9)
NEUTROS ABS: 5.8 10*3/uL (ref 1.4–6.5)
NEUTROS PCT: 88 %
Platelets: 220 10*3/uL (ref 150–440)
RBC: 4.5 MIL/uL (ref 3.80–5.20)
RDW: 12.3 % (ref 11.5–14.5)
WBC: 6.7 10*3/uL (ref 3.6–11.0)

## 2015-09-11 MED ORDER — TAMOXIFEN CITRATE 20 MG PO TABS: 20.0000 mg | ORAL_TABLET | Freq: Every day | ORAL | Status: DC

## 2015-09-11 NOTE — Progress Notes (Signed)
Patient states she thinks she had food poisoning yesterday.  She was sick and throwing up. States she is sore in her abdomen and legs.

## 2015-09-11 NOTE — Progress Notes (Signed)
Post @ [facility] [phone]  Fax:([phone]) [place]: April 21, 1966  MR#: [id number]  EQA#:[id number]  Patient Care Team: No Pcp Per Patient as PCP - General (General Practice) Seeplaputhur Robinette Haines, MD (General Surgery) Rico Junker, RN as Registered Nurse (Unknown Physician Specialty)  CHIEF COMPLAINT:  Chief Complaint  Patient presents with  . Breast Cancer    Oncology History   1. Carcinoma of breast T2, N1, M0 tumor. Estrogen receptor positive progesterone receptor positive,  HER-2 negative by be  FISH  study. 2. Patient finished neoadjuvant chemotherapy in August of 2011. 3. April 16, 2010, patient had lumpectomy,axillary node dissection. 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 May 14, 2010, Patient had further wide excision and negative for invasive cancer positive for ductal carcinoma in situ. Margins are clear. 5. Chemoradiation his finished in December of 2011. 6. Tamoxifen started in December of 2011     Malignant neoplasm of upper-outer quadrant of female breast [facility])    Initial Diagnosis Malignant neoplasm of upper-outer quadrant of female breast  7breast cancer index shows 3.5% risk of late recurrence and high likelihood of benefit of continuing extended endocrine therapy.  (January, 2017) Will continue tamoxifen.patient did not by medication because her Medicaid expired which are now being reinstated.   INTERVAL HISTORY:  50 year old lady with a history of carcinoma of breast. Patient is taking tamoxifen getting regular mammograms.  Patient is also being evaluated by GYN oncologist for abnormal Pap smear No bony pains appetite has been stable  Mammogram August of 2016 is within normal limit REVIEW OF SYSTEMS:   GENERAL:  Feels good.  Active.  No fevers, sweats or weight loss. PERFORMANCE STATUS (ECOG):0 HEENT:  No visual changes, runny nose, sore throat, mouth sores or  tenderness. Lungs: No shortness of breath or cough.  No hemoptysis. Cardiac:  No chest pain, palpitations, orthopnea, or PND. GI:  No nausea, vomiting, diarrhea, constipation, melena or hematochezia. GU:  No urgency, frequency, dysuria, or hematuria. Musculoskeletal:  No back pain.  No joint pain.  No muscle tenderness. Extremities:  No pain or swelling. Skin:  No rashes or skin changes. Neuro:  No headache, numbness or weakness, balance or coordination issues. Endocrine:  No diabetes, thyroid issues, hot flashes or night sweats. Psych:  No mood changes, depression or anxiety. Pain:  No focal pain. Review of systems:  All other systems reviewed and found to be negative. As per HPI. Otherwise, a complete review of systems is negatve.  PAST MEDICAL HISTORY: Past Medical History  Diagnosis Date  . Cancer [facility]) 2011    left breast  . Personal history of tobacco use, presenting hazards to health 2013  . Obesity, unspecified 2013  . Malignant neoplasm of upper-outer quadrant of female breast [facility]) 2011    left breast  . Breast cancer ([place]) 2011    left breast, chemo and radiation  . Dog bite of face 04-04-15    PAST SURGICAL HISTORY: Past Surgical History  Procedure Laterality Date  . Cesarean section  1989  . Portacath placement  2011  . Breast surgery Left 2011    lumpectomy radiation and chemo.  . Breast excisional biopsy Left 2011    positive  Significant History/PMH:   Low back pain:    Left Breast Cancer: 2011   Anemia:    migraines:    Port-A-Cath Removal: 2012   Left Lumpectomy:    Cesarean Section:  Port A Cath/ Right:     Smoking History: Smoking History quit 5 years ago(1)  PFSH: Comments: No family history of colorectal cancer, breast cancer, or ovarian cancer.  Social History: negative tobacco, social  use of alcohol  Additional Past Medical and Surgical History: Please see above   ADVANCED DIRECTIVES:  No flowsheet data found.  HEALTH  MAINTENANCE: Social History  Substance Use Topics  . Smoking status: Former Smoker -- 2 years    Types: Cigarettes  . Smokeless tobacco: Never Used     Comment: 1-2 cigarettes a day  . Alcohol Use: No      Allergies  Allergen Reactions  . Aleve [Naproxen Sodium] Other (See Comments)    Stomach pain/gas pains/upset stomach    No current outpatient prescriptions on file.   No current facility-administered medications for this visit.    OBJECTIVE:  Filed Vitals:   09/11/15 1042  BP: 130/90  Pulse: 109  Temp: 98 F (36.7 C)  Resp: 18     Body mass index is 27.29 kg/(m^2).    ECOG FS:0 - Asymptomatic  PHYSICAL EXAM: GENERAL  status: Performance status is good.  Patient has not lost significant weight HEENT: No evidence of stomatitis. Sclera and conjunctivae :: No jaundice.   pale looking. Lungs: Air  entry equal on both sides.  No rhonchi.  No rales.  Cardiac: Heart sounds are normal.  No pericardial rub.  No murmur. Lymphatic system: Cervical, axillary, inguinal, lymph nodes not palpable GI: Abdomen is soft.  No ascites.  Liver spleen not palpable.  No tenderness.  Bowel sounds are within normal limit Lower extremity: No edema Neurological system: Higher functions, cranial nerves intact no evidence of peripheral neuropathy. Skin: No rash.  No ecchymosis.. Breast: No palpable masses.  Previous radiation.  Right breast free of masses.  LAB RESULTS:  Appointment on 09/11/2015  Component Date Value Ref Range Status  . WBC 09/11/2015 6.7  3.6 - 11.0 K/uL Final  . RBC 09/11/2015 4.50  3.80 - 5.20 MIL/uL Final  . Hemoglobin 09/11/2015 14.4  12.0 - 16.0 g/dL Final  . HCT 09/11/2015 41.8  35.0 - 47.0 % Final  . MCV 09/11/2015 92.8  80.0 - 100.0 fL Final  . MCH 09/11/2015 31.9  26.0 - 34.0 pg Final  . MCHC 09/11/2015 34.4  32.0 - 36.0 g/dL Final  . RDW 09/11/2015 12.3  11.5 - 14.5 % Final  . Platelets 09/11/2015 220  150 - 440 K/uL Final  . Neutrophils Relative % 09/11/2015  88   Final  . Neutro Abs 09/11/2015 5.8  1.4 - 6.5 K/uL Final  . Lymphocytes Relative 09/11/2015 9   Final  . Lymphs Abs 09/11/2015 0.6* 1.0 - 3.6 K/uL Final  . Monocytes Relative 09/11/2015 3   Final  . Monocytes Absolute 09/11/2015 0.2  0.2 - 0.9 K/uL Final  . Eosinophils Relative 09/11/2015 0   Final  . Eosinophils Absolute 09/11/2015 0.0  0 - 0.7 K/uL Final  . Basophils Relative 09/11/2015 0   Final  . Basophils Absolute 09/11/2015 0.0  0 - 0.1 K/uL Final  . Sodium 09/11/2015 131* 135 - 145 mmol/L Final  . Potassium 09/11/2015 3.6  3.5 - 5.1 mmol/L Final  . Chloride 09/11/2015 100* 101 - 111 mmol/L Final  . CO2 09/11/2015 24  22 - 32 mmol/L Final  . Glucose, Bld 09/11/2015 126* 65 - 99 mg/dL Final  . BUN 09/11/2015 14  6 - 20 mg/dL Final  . Creatinine, Ser  09/11/2015 0.66  0.44 - 1.00 mg/dL Final  . Calcium 09/11/2015 8.6* 8.9 - 10.3 mg/dL Final  . Total Protein 09/11/2015 7.3  6.5 - 8.1 g/dL Final  . Albumin 09/11/2015 4.1  3.5 - 5.0 g/dL Final  . AST 09/11/2015 23  15 - 41 U/L Final  . ALT 09/11/2015 23  14 - 54 U/L Final  . Alkaline Phosphatase 09/11/2015 54  38 - 126 U/L Final  . Total Bilirubin 09/11/2015 1.4* 0.3 - 1.2 mg/dL Final  . GFR calc non Af Amer 09/11/2015 >60  >60 mL/min Final  . GFR calc Af Amer 09/11/2015 >60  >60 mL/min Final   Comment: (NOTE) The eGFR has been calculated using the CKD EPI equation. This calculation has not been validated in all clinical situations. eGFR's persistently <60 mL/min signify possible Chronic Kidney Disease.   . Anion gap 09/11/2015 7  5 - 15 Final     ASSESSMENT: Carcinoma of left breast there is no evidence of recurrent or progressive disease continue tamoxifen until December of 2016   Did not by tamoxifen.  No vaginal bleeding at present time MEDICAL DECISION MAKING:  Mammogram as been ordered for the August of 2016 mammogram in August of 2015 was birad 1 . Continue tamoxifen until December of 2016as breast cancer index  shows high likelihood of benefit Patient is being followed by GYN oncologist We are checking patient's serum [place]  AND    LH   And if it suggests     That   patient is in menopause then patient can be switched over to aromatase inhibitor Reevaluate patient in 6 months with a repeat mammogram Duration of visit is 25 minutes and 50% of time was spent discussing VARIOUS  Options   has been discussed with breast navigator oPatient expressed understanding and was in agreement with this plan. She also understands that She can call clinic at any time with any questions, concerns, or complaints.    No matching staging information was found for the patient.  Forest Gleason, MD   09/11/2015 11:19 AM

## 2015-10-09 ENCOUNTER — Ambulatory Visit: Payer: Self-pay

## 2016-03-11 ENCOUNTER — Encounter: Payer: Self-pay | Admitting: *Deleted

## 2016-04-06 ENCOUNTER — Other Ambulatory Visit: Payer: Self-pay | Admitting: Oncology

## 2016-04-06 ENCOUNTER — Ambulatory Visit
Admission: RE | Admit: 2016-04-06 | Discharge: 2016-04-06 | Disposition: A | Discharge status: Home / Self Care | Payer: Medicaid Other | Source: Ambulatory Visit | Attending: Oncology | Admitting: Oncology

## 2016-04-06 DIAGNOSIS — C50412 Malignant neoplasm of upper-outer quadrant of left female breast: Secondary | ICD-10-CM | POA: Insufficient documentation

## 2016-04-13 ENCOUNTER — Ambulatory Visit: Payer: Medicaid Other | Admitting: General Surgery

## 2016-04-15 ENCOUNTER — Inpatient Hospital Stay (HOSPITAL_BASED_OUTPATIENT_CLINIC_OR_DEPARTMENT_OTHER): Payer: Self-pay | Admitting: Oncology

## 2016-04-15 ENCOUNTER — Encounter: Payer: Self-pay | Admitting: Oncology

## 2016-04-15 ENCOUNTER — Ambulatory Visit: Payer: Self-pay | Admitting: Oncology

## 2016-04-15 ENCOUNTER — Encounter (INDEPENDENT_AMBULATORY_CARE_PROVIDER_SITE_OTHER): Payer: Self-pay

## 2016-04-15 ENCOUNTER — Inpatient Hospital Stay: Payer: Medicaid Other | Attending: Oncology

## 2016-04-15 VITALS — BP 143/89 | HR 73 | Temp 97.9°F | Ht 62.0 in | Wt 151.1 lb

## 2016-04-15 DIAGNOSIS — F419 Anxiety disorder, unspecified: Secondary | ICD-10-CM

## 2016-04-15 DIAGNOSIS — C50412 Malignant neoplasm of upper-outer quadrant of left female breast: Secondary | ICD-10-CM

## 2016-04-15 DIAGNOSIS — Z853 Personal history of malignant neoplasm of breast: Secondary | ICD-10-CM | POA: Insufficient documentation

## 2016-04-15 DIAGNOSIS — R5383 Other fatigue: Secondary | ICD-10-CM

## 2016-04-15 DIAGNOSIS — Z87891 Personal history of nicotine dependence: Secondary | ICD-10-CM

## 2016-04-15 DIAGNOSIS — R531 Weakness: Secondary | ICD-10-CM | POA: Insufficient documentation

## 2016-04-15 DIAGNOSIS — R0602 Shortness of breath: Secondary | ICD-10-CM

## 2016-04-15 DIAGNOSIS — M2550 Pain in unspecified joint: Secondary | ICD-10-CM | POA: Insufficient documentation

## 2016-04-15 DIAGNOSIS — M549 Dorsalgia, unspecified: Secondary | ICD-10-CM

## 2016-04-15 DIAGNOSIS — E669 Obesity, unspecified: Secondary | ICD-10-CM | POA: Insufficient documentation

## 2016-04-15 DIAGNOSIS — F329 Major depressive disorder, single episode, unspecified: Secondary | ICD-10-CM

## 2016-04-15 LAB — COMPREHENSIVE METABOLIC PANEL
ALBUMIN: 4.6 g/dL (ref 3.5–5.0)
ALT: 39 U/L (ref 14–54)
AST: 39 U/L (ref 15–41)
Alkaline Phosphatase: 77 U/L (ref 38–126)
Anion gap: 6 (ref 5–15)
BUN: 9 mg/dL (ref 6–20)
CHLORIDE: 101 mmol/L (ref 101–111)
CO2: 24 mmol/L (ref 22–32)
CREATININE: 0.59 mg/dL (ref 0.44–1.00)
Calcium: 8.8 mg/dL — ABNORMAL LOW (ref 8.9–10.3)
GFR calc Af Amer: >60 mL/min (ref >60)
GFR calc non Af Amer: >60 mL/min (ref >60)
Glucose, Bld: 205 mg/dL — ABNORMAL HIGH (ref 65–99)
POTASSIUM: 3.6 mmol/L (ref 3.5–5.1)
SODIUM: 131 mmol/L — AB (ref 135–145)
Total Bilirubin: 1.7 mg/dL — ABNORMAL HIGH (ref 0.3–1.2)
Total Protein: 7.3 g/dL (ref 6.5–8.1)

## 2016-04-15 LAB — CBC WITH DIFFERENTIAL/PLATELET
BASOS ABS: 0.1 10*3/uL (ref 0–0.1)
EOS ABS: 0.1 10*3/uL (ref 0–0.7)
HCT: 39 % (ref 35.0–47.0)
Hemoglobin: 14.1 g/dL (ref 12.0–16.0)
Lymphs Abs: 1.9 10*3/uL (ref 1.0–3.6)
MCH: 33 pg (ref 26.0–34.0)
MCHC: 36.1 g/dL — ABNORMAL HIGH (ref 32.0–36.0)
MCV: 91.2 fL (ref 80.0–100.0)
Monocytes Absolute: 0.3 10*3/uL (ref 0.2–0.9)
Monocytes Relative: 5 %
Neutro Abs: 4.4 10*3/uL (ref 1.4–6.5)
Neutrophils Relative %: 64 %
PLATELETS: 262 10*3/uL (ref 150–440)
RBC: 4.28 MIL/uL (ref 3.80–5.20)
RDW: 12.8 % (ref 11.5–14.5)
WBC: 6.8 10*3/uL (ref 3.6–11.0)

## 2016-04-15 NOTE — Progress Notes (Signed)
Patient states she is not taking her tamoxifin because she does not have the money for the drug.

## 2016-04-16 ENCOUNTER — Encounter: Payer: Self-pay | Admitting: *Deleted

## 2016-04-16 LAB — ESTRADIOL

## 2016-04-16 LAB — FSH/LH
FSH: 25 m[IU]/mL
LH: 22.7 m[IU]/mL

## 2016-04-18 NOTE — Progress Notes (Signed)
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  INTERVAL HISTORY: Patient returns to clinic today for routine 6 month follow-up. She has multiple medical complaints that are chronic and unchanged in nature. She is no neurologic complaints. She denies any recent fevers or illnesses. She has no chest pain or shortness of breath. She denies any nausea, vomiting, constipation, or diarrhea. She has no urinary complaint. Patient otherwise feels well and offers no further specific complaints.  REVIEW OF SYSTEMS:   Review of Systems  Constitutional: Positive for malaise/fatigue. Negative for fever and weight loss.  Respiratory: Positive for shortness of breath. Negative for cough.   Cardiovascular: Negative.  Negative for chest pain and leg swelling.  Gastrointestinal: Negative.  Negative for abdominal pain.  Genitourinary: Negative.   Musculoskeletal: Positive for back pain and joint pain.  Neurological: Positive for weakness.  Psychiatric/Behavioral: Positive for depression. The patient is nervous/anxious.     As per HPI. Otherwise, a complete review of systems is negatve.  PAST MEDICAL HISTORY: Past Medical History:  Diagnosis Date  . Breast cancer ([place]) 2011   left breast, chemo and radiation  . Cancer [facility]) 2011   left breast  . Dog bite of face 04-04-15  . Malignant neoplasm of upper-outer quadrant of female breast ([place]) 2011   left breast  . Obesity, unspecified 2013  . Personal history of tobacco use, presenting hazards to health 2013    PAST SURGICAL HISTORY: Past Surgical  History:  Procedure Laterality Date  . BREAST EXCISIONAL BIOPSY Left 2011   positive  . BREAST SURGERY Left 2011   lumpectomy radiation and chemo.  Marland Kitchen CESAREAN SECTION  1989  . PORTACATH PLACEMENT  2011    FAMILY HISTORY: History reviewed. No pertinent family history.     ADVANCED DIRECTIVES (Y/N):  N   HEALTH MAINTENANCE: Social History  Substance Use Topics  . Smoking status: Former Smoker    Years: 2.00    Types: Cigarettes  . Smokeless tobacco: Never Used     Comment: 1-2 cigarettes a day  . Alcohol use No     Colonoscopy:  PAP:  Bone density:  Lipid panel:  Allergies  Allergen Reactions  . Aleve [Naproxen Sodium] Other (See Comments)    Stomach pain/gas pains/upset stomach    Current Outpatient Prescriptions  Medication Sig Dispense Refill  . tamoxifen (NOLVADEX) 20 MG tablet Take 1 tablet (20 mg total) by mouth daily. (Patient not taking: Reported on 04/15/2016) 30 tablet 6   No current facility-administered medications for this visit.     OBJECTIVE: Vitals:   04/15/16 1155  BP: (!) 143/89  Pulse: 73  Temp: 97.9 F (36.6 C)     Body mass index is 27.64 kg/m.    ECOG FS:0 - Asymptomatic  General: Well-developed, well-nourished, no acute distress. Eyes: Pink conjunctiva, anicteric sclera. Breasts: Patient refused exam today. Lungs: Clear to auscultation bilaterally. Heart: Regular rate and rhythm. No rubs, murmurs, or gallops. Abdomen: Soft, nontender, nondistended. No organomegaly noted, normoactive bowel sounds. Musculoskeletal: No edema, cyanosis, or clubbing. Neuro: Alert, answering all questions appropriately. Cranial nerves grossly intact. Skin: No rashes or petechiae noted. Psych: Normal affect.  LAB RESULTS:  Lab Results  Component Value Date   NA 131 (L) 04/15/2016   K 3.6 04/15/2016   CL 101 04/15/2016   CO2 24 04/15/2016   GLUCOSE 205 (H) 04/15/2016   BUN 9 04/15/2016   CREATININE 0.59 04/15/2016   CALCIUM 8.8 (L) 04/15/2016    PROT 7.3 04/15/2016   ALBUMIN 4.6 04/15/2016   AST 39 04/15/2016   ALT 39 04/15/2016   ALKPHOS 77 04/15/2016   BILITOT 1.7 (H) 04/15/2016   GFRNONAA >60 04/15/2016   GFRAA >60 04/15/2016    Lab Results  Component Value Date   WBC 6.8 04/15/2016   NEUTROABS 4.4 04/15/2016   HGB 14.1 04/15/2016   HCT 39.0 04/15/2016   MCV 91.2 04/15/2016   PLT 262 04/15/2016     STUDIES: US Breast Ltd Uni Left Inc Axilla  Result Date: 04/06/2016 CLINICAL DATA:  History of treated left breast cancer status post lumpectomy, radiation and chemotherapy in 2011. EXAM: 2D DIGITAL DIAGNOSTIC BILATERAL MAMMOGRAM WITH CAD AND ADJUNCT TOMO ULTRASOUND LEFT BREAST COMPARISON:  Previous exam(s). ACR Breast Density Category c: The breast tissue is heterogeneously dense, which may obscure small masses. FINDINGS: Mammographically, there are no suspicious masses, areas of nonsurgical architectural distortion or microcalcifications in either breast. There are stable post lumpectomy changes in the left breast upper outer quadrant and left axilla. However, a global decrease in the size of the left breast is noted when compared to patient's prior postsurgical mammograms. Mammographic images were processed with CAD. On physical exam, no suspicious masses are detected. No skin thickening or nipple discoloration is seen. Targeted ultrasound is performed, showing no suspicious masses or shadowing lesions. IMPRESSION: Decrease in the volume of the left breast, when compared to patient's prior postsurgical mammograms. Otherwise no focal mass is seen by mammogram or ultrasound. RECOMMENDATION: Surgical consult is recommended because of the decrease in the volume of the left breast. Further evaluation with breast MRI may also be considered. I have discussed the findings and recommendations with the patient. Results were also provided in writing at the conclusion of the visit. If applicable, a reminder letter will be sent to the patient  regarding the next appointment. BI-RADS CATEGORY  0: Incomplete. Need additional imaging evaluation and/or prior mammograms for comparison. Electronically Signed   By: Fidela Salisbury M.D.   On: 04/06/2016 15:27   Mm Diag Breast Tomo Bilateral  Result Date: 04/06/2016 CLINICAL DATA:  History of treated left breast cancer status post lumpectomy, radiation and chemotherapy in 2011. EXAM: 2D DIGITAL DIAGNOSTIC BILATERAL MAMMOGRAM WITH CAD AND ADJUNCT TOMO ULTRASOUND LEFT BREAST COMPARISON:  Previous exam(s). ACR Breast Density Category c: The breast tissue is heterogeneously dense, which may obscure small masses. FINDINGS: Mammographically, there are no suspicious masses, areas of nonsurgical architectural distortion or microcalcifications in either breast. There are stable post lumpectomy changes in the left breast upper outer quadrant and left axilla. However, a global decrease in the size of the left breast is noted when compared to patient's prior postsurgical mammograms. Mammographic images were processed with CAD. On physical exam, no suspicious masses are detected. No skin thickening or nipple discoloration is seen. Targeted ultrasound is performed, showing no suspicious masses or shadowing lesions. IMPRESSION: Decrease in the volume of the left breast, when compared to patient's prior postsurgical mammograms. Otherwise no focal mass is seen by mammogram or ultrasound. RECOMMENDATION: Surgical consult is recommended because of the decrease in the volume of the left breast. Further evaluation with breast MRI may also  be considered. I have discussed the findings and recommendations with the patient. Results were also provided in writing at the conclusion of the visit. If applicable, a reminder letter will be sent to the patient regarding the next appointment. BI-RADS CATEGORY  0: Incomplete. Need additional imaging evaluation and/or prior mammograms for comparison. Electronically Signed   By: Fidela Salisbury M.D.   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  PLAN:    1. 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 She completed neoadjuvant chemotherapy in August 2011. Her subsequent lumpectomy revealed decrease tumor burden with the above stated pathology. She completed her XRT in December 2011. Patient initiated tamoxifen in December 2011, but did not complete 5 years of treatment. Her most recent mammogram on April 06, 2016 was recorded as BI-RADS 0. It was recommended that patient have an MRI of her breast to further evaluate, but patient states she unlikely we will be able to make the [facility] for this exam. Given patient's difficulty, consider repeat mammogram in 6 months. Return to clinic in 6 months for further evaluation.  Approximately 30 minutes was spent in discussion of which greater than 50% was consultation.    Patient expressed understanding and was in agreement with this plan. She also understands that She can call clinic at any time with any questions, concerns, or complaints.   Primary cancer of upper outer quadrant of left female breast [facility])   Staging form: Breast, AJCC 7th Edition   - Pathologic stage from 04/18/2016: Stage IA (T1c, N0, cM0) - Signed by Lloyd Huger, MD on 04/18/2016  Lloyd Huger, MD   04/18/2016 9:11 AM

## 2016-04-21 ENCOUNTER — Telehealth: Payer: Self-pay | Admitting: *Deleted

## 2016-04-21 NOTE — Telephone Encounter (Signed)
Left message for patient to call the office back . She needs additional views. She was scheduled on 04/22/16 to come see Dr.Byrnett, appt canceled and will need to be r/s once she gets her additional views scheduled.

## 2016-04-22 ENCOUNTER — Ambulatory Visit: Payer: Medicaid Other | Admitting: General Surgery

## 2016-04-25 ENCOUNTER — Inpatient Hospital Stay: Admission: RE | Admit: 2016-04-25 | Payer: Medicaid Other | Source: Ambulatory Visit

## 2016-08-27 ENCOUNTER — Encounter: Payer: Self-pay | Admitting: *Deleted

## 2016-08-27 ENCOUNTER — Telehealth: Payer: Self-pay | Admitting: *Deleted

## 2016-08-27 NOTE — Telephone Encounter (Signed)
Received voicemail that patient wanted her next appointment date mailed to her.  Letter mailed with next appointment date of 10/14/16 @ 10:15.  Van pick up at 9:00.

## 2016-10-13 NOTE — Progress Notes (Signed)
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  INTERVAL HISTORY: Patient returns to clinic today for routine 6 month follow-up. She has multiple medical complaints that are chronic and unchanged in nature. She has no neurologic complaints. She denies any recent fevers or illnesses. She has no chest pain or shortness of breath. She denies any nausea, vomiting, constipation, or diarrhea. She has no urinary complaint. Patient otherwise feels well and offers no further specific complaints.  REVIEW OF SYSTEMS:   Review of Systems  Constitutional: Positive for malaise/fatigue. Negative for fever and weight loss.  Respiratory: Positive for shortness of breath. Negative for cough.   Cardiovascular: Negative.  Negative for chest pain and leg swelling.  Gastrointestinal: Negative.  Negative for abdominal pain.  Genitourinary: Negative.   Musculoskeletal: Positive for back pain and joint pain.  Neurological: Positive for weakness.  Psychiatric/Behavioral: Positive for depression. The patient is nervous/anxious.    As per HPI. Otherwise, a complete review of systems is negative.    PAST MEDICAL HISTORY: Past Medical History:  Diagnosis Date  . Breast cancer ([place]) 2011   left breast, chemo and radiation  . Cancer [facility]) 2011   left breast  . Dog bite of face 04-04-15  . Malignant neoplasm of upper-outer quadrant of female breast ([place]) 2011   left breast  . Obesity, unspecified 2013  . Personal history of tobacco use, presenting hazards to health 2013    PAST SURGICAL HISTORY: Past Surgical  History:  Procedure Laterality Date  . BREAST EXCISIONAL BIOPSY Left 2011   positive  . BREAST SURGERY Left 2011   lumpectomy radiation and chemo.  Marland Kitchen CESAREAN SECTION  1989  . PORTACATH PLACEMENT  2011    FAMILY HISTORY: History reviewed. No pertinent family history.     ADVANCED DIRECTIVES (Y/N):  N   HEALTH MAINTENANCE: Social History  Substance Use Topics  . Smoking status: Former Smoker    Years: 2.00    Types: Cigarettes  . Smokeless tobacco: Never Used     Comment: 1-2 cigarettes a day  . Alcohol use No     Colonoscopy:  PAP:  Bone density:  Lipid panel:  Allergies  Allergen Reactions  . Aleve [Naproxen Sodium] Other (See Comments)    Stomach pain/gas pains/upset stomach    Current Outpatient Prescriptions  Medication Sig Dispense Refill  . tamoxifen (NOLVADEX) 20 MG tablet Take 1 tablet (20 mg total) by mouth daily. (Patient not taking: Reported on 10/14/2016) 30 tablet 6   No current facility-administered medications for this visit.     OBJECTIVE: Vitals:   10/14/16 1025  Temp: 97.1 F (36.2 C)     Body mass index is 27.06 kg/m.    ECOG FS:0 - Asymptomatic  General: Well-developed, well-nourished, no acute distress. Eyes: Pink conjunctiva, anicteric sclera. Breasts: Patient refused exam today. Lungs: Clear to auscultation bilaterally. Heart: Regular rate and rhythm. No rubs, murmurs, or gallops. Abdomen: Soft, nontender, nondistended. No organomegaly noted, normoactive bowel sounds. Musculoskeletal: No edema, cyanosis, or clubbing. Neuro: Alert, answering all questions appropriately. Cranial nerves grossly intact. Skin: No rashes or petechiae noted. Psych: Normal affect.   LAB RESULTS:  Lab Results  Component Value Date   NA 131 (L) 04/15/2016   K 3.6 04/15/2016   CL 101 04/15/2016   CO2 24 04/15/2016   GLUCOSE 205 (H) 04/15/2016   BUN 9 04/15/2016   CREATININE 0.59 04/15/2016   CALCIUM 8.8 (L) 04/15/2016   PROT 7.3 04/15/2016    ALBUMIN 4.6 04/15/2016   AST 39 04/15/2016   ALT 39 04/15/2016   ALKPHOS 77 04/15/2016   BILITOT 1.7 (H) 04/15/2016   GFRNONAA >60 04/15/2016   GFRAA >60 04/15/2016    Lab Results  Component Value Date   WBC 6.8 04/15/2016   NEUTROABS 4.4 04/15/2016   HGB 14.1 04/15/2016   HCT 39.0 04/15/2016   MCV 91.2 04/15/2016   PLT 262 04/15/2016     STUDIES: No results found.  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  PLAN:    1. 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 She completed neoadjuvant chemotherapy in August 2011. Her subsequent lumpectomy revealed decrease tumor burden with the above stated pathology. She completed her XRT in December 2011. Patient initiated tamoxifen in December 2011, but did not complete 5 years of treatment. Her most recent mammogram on April 06, 2016 was recorded as BI-RADS 0. Despite recommendations for additional imaging, patient was unable to get a MRI or repeat mammogram. Will get a mammogram in the next 1-2 weeks to further evaluate.  Return to clinic in 6 months for further evaluation unless her mammogram is suspicious. She will then return to clinic sooner to discuss the results.  Approximately 30 minutes was spent in discussion of which greater than 50% was consultation.    Patient expressed understanding and was in agreement with this plan. She also understands that She can call clinic at any time with any questions, concerns, or complaints.   Cancer Staging Primary cancer of upper outer quadrant of left female breast [facility]) Staging form: Breast, AJCC 7th Edition - Pathologic stage from 04/18/2016: Stage IA (T1c, N0, cM0) - Signed by Lloyd Huger, MD on 04/18/2016   Lloyd Huger, MD   10/17/2016 11:58 AM

## 2016-10-14 ENCOUNTER — Inpatient Hospital Stay: Payer: Medicaid Other

## 2016-10-14 ENCOUNTER — Inpatient Hospital Stay: Payer: Self-pay | Attending: Oncology | Admitting: Oncology

## 2016-10-14 ENCOUNTER — Encounter: Payer: Self-pay | Admitting: Oncology

## 2016-10-14 ENCOUNTER — Other Ambulatory Visit: Payer: Self-pay | Admitting: Oncology

## 2016-10-14 VITALS — Temp 97.1°F | Wt 147.9 lb

## 2016-10-14 DIAGNOSIS — E669 Obesity, unspecified: Secondary | ICD-10-CM

## 2016-10-14 DIAGNOSIS — M549 Dorsalgia, unspecified: Secondary | ICD-10-CM

## 2016-10-14 DIAGNOSIS — C50412 Malignant neoplasm of upper-outer quadrant of left female breast: Secondary | ICD-10-CM

## 2016-10-14 DIAGNOSIS — M2550 Pain in unspecified joint: Secondary | ICD-10-CM

## 2016-10-14 DIAGNOSIS — R0602 Shortness of breath: Secondary | ICD-10-CM

## 2016-10-14 DIAGNOSIS — Z923 Personal history of irradiation: Secondary | ICD-10-CM

## 2016-10-14 DIAGNOSIS — R531 Weakness: Secondary | ICD-10-CM

## 2016-10-14 DIAGNOSIS — Z87891 Personal history of nicotine dependence: Secondary | ICD-10-CM

## 2016-10-14 DIAGNOSIS — R5383 Other fatigue: Secondary | ICD-10-CM

## 2016-10-14 DIAGNOSIS — Z9221 Personal history of antineoplastic chemotherapy: Secondary | ICD-10-CM

## 2016-10-14 DIAGNOSIS — F329 Major depressive disorder, single episode, unspecified: Secondary | ICD-10-CM

## 2016-10-14 DIAGNOSIS — Z853 Personal history of malignant neoplasm of breast: Secondary | ICD-10-CM

## 2016-10-27 ENCOUNTER — Encounter: Payer: Self-pay | Admitting: Podiatry

## 2016-10-27 ENCOUNTER — Ambulatory Visit (INDEPENDENT_AMBULATORY_CARE_PROVIDER_SITE_OTHER): Payer: Self-pay | Admitting: Podiatry

## 2016-10-27 DIAGNOSIS — B351 Tinea unguium: Secondary | ICD-10-CM

## 2016-10-27 DIAGNOSIS — M79609 Pain in unspecified limb: Principal | ICD-10-CM

## 2016-10-27 DIAGNOSIS — L608 Other nail disorders: Secondary | ICD-10-CM

## 2016-10-27 DIAGNOSIS — L603 Nail dystrophy: Secondary | ICD-10-CM

## 2016-10-27 DIAGNOSIS — L600 Ingrowing nail: Secondary | ICD-10-CM

## 2016-10-27 NOTE — Patient Instructions (Signed)

## 2016-10-30 ENCOUNTER — Ambulatory Visit
Admission: RE | Admit: 2016-10-30 | Discharge: 2016-10-30 | Disposition: A | Discharge status: Home / Self Care | Payer: Medicaid Other | Source: Ambulatory Visit | Attending: Oncology | Admitting: Oncology

## 2016-10-30 DIAGNOSIS — C50412 Malignant neoplasm of upper-outer quadrant of left female breast: Secondary | ICD-10-CM | POA: Insufficient documentation

## 2016-10-30 HISTORY — DX: Personal history of antineoplastic chemotherapy: Z92.21

## 2016-10-30 HISTORY — DX: Personal history of irradiation: Z92.3

## 2016-11-05 NOTE — Progress Notes (Signed)
   Subjective: Patient presents today as a new patient for painful fungal nails to the bilateral great toes. Patient states that she has been dealing with painful thickened nails for several months now. There is been no alleviation of symptoms for the patient. Patient is unable to wear shoe gear without pain. Patient would like to have her toenails removed.  Objective:  General: Well developed, nourished, in no acute distress, alert and oriented x3   Dermatology: Skin is warm, dry and supple bilateral. Bilateral great toenails appears to be erythematous with evidence of an ingrowing nail. Hyperkeratotic, dystrophic, discolored nails are also noted to the bilateral great toes.   Vascular: Dorsalis Pedis artery and Posterior Tibial artery pedal pulses palpable. No lower extremity edema noted.   Neruologic: Grossly intact via light touch bilateral.  Musculoskeletal: Significant pain on palpation and direct decompression of the bilateral great toenails   Assesement: #1 painful and symptomatic onychomycosis bilateral great toenails  Plan of Care:  1. Patient evaluated.  2. Discussed treatment alternatives and plan of care. Explained nail avulsion procedure and post procedure course to patient. 3. Patient opted for total permanent nail avulsion to the bilateral great toes.  4. Prior to procedure, local anesthesia infiltration utilized using 3 ml of a 50:50 mixture of 2% plain lidocaine and 0.5% plain marcaine in a normal hallux block fashion and a betadine prep performed.  5. Total permanent nail avulsion with chemical matrixectomy performed using [id number] applications of phenol followed by alcohol flush.  6. Light dressing applied. 7. Return to clinic in 2 weeks.   Edrick Kins, DPM [facility]  Dr. Edrick Kins, [address]                                        [place], [place] [address]                Office [phone]  Fax [phone]

## 2016-11-10 ENCOUNTER — Encounter: Payer: Self-pay | Admitting: Podiatry

## 2016-11-10 ENCOUNTER — Ambulatory Visit (INDEPENDENT_AMBULATORY_CARE_PROVIDER_SITE_OTHER): Payer: Self-pay | Admitting: Podiatry

## 2016-11-10 DIAGNOSIS — M79676 Pain in unspecified toe(s): Secondary | ICD-10-CM

## 2016-11-10 DIAGNOSIS — S91209D Unspecified open wound of unspecified toe(s) with damage to nail, subsequent encounter: Secondary | ICD-10-CM

## 2016-11-10 DIAGNOSIS — S91109D Unspecified open wound of unspecified toe(s) without damage to nail, subsequent encounter: Secondary | ICD-10-CM

## 2016-11-10 MED ORDER — DOXYCYCLINE HYCLATE 100 MG PO TABS: 100.0000 mg | ORAL_TABLET | Freq: Two times a day (BID) | ORAL | 0 refills | Status: DC

## 2016-11-18 NOTE — Progress Notes (Signed)
   Subjective: Patient presents today 2 weeks post ingrown nail permanent nail avulsion procedure. Patient states that the toe and nail fold is feeling much better.  Objective: Skin is warm, dry and supple. Nail and respective nail fold appears to be healing appropriately. Open wound to the associated nail fold with a granular wound base and moderate amount of fibrotic tissue. Minimal drainage noted. Mild erythema around the periungual region likely due to phenol chemical matricectomy.  Assessment: #1 postop permanent Total nail avulsion of the bilateral great toes #2 open wound nail bed of respective digit.   Plan of care: #1 patient was evaluated  #2 debridement of open wound was performed to the nail bed of the respective toe using a currette. Antibiotic ointment and Band-Aid was applied. #3 prescription for doxycycline 100 mg for localized possible cellulitis to the bilateral great toes. t is hard to determine if the erythema to the bilateral great toes is from the application of phenol or cellulitis. We're going to prescribe doxycycline for  Preventative safety measures #4 return to clinic when necessary   Edrick Kins, DPM [facility]  Dr. Edrick Kins, [address]                                        [place], [place] [address]                Office [phone]  Fax [phone]

## 2017-01-20 ENCOUNTER — Encounter: Payer: Self-pay | Admitting: *Deleted

## 2017-04-13 NOTE — Progress Notes (Deleted)
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  INTERVAL HISTORY: Patient returns to clinic today for routine 6 month follow-up. She has multiple medical complaints that are chronic and unchanged in nature. She has no neurologic complaints. She denies any recent fevers or illnesses. She has no chest pain or shortness of breath. She denies any nausea, vomiting, constipation, or diarrhea. She has no urinary complaint. Patient otherwise feels well and offers no further specific complaints.  REVIEW OF SYSTEMS:   Review of Systems  Constitutional: Positive for malaise/fatigue. Negative for fever and weight loss.  Respiratory: Positive for shortness of breath. Negative for cough.   Cardiovascular: Negative.  Negative for chest pain and leg swelling.  Gastrointestinal: Negative.  Negative for abdominal pain.  Genitourinary: Negative.   Musculoskeletal: Positive for back pain and joint pain.  Neurological: Positive for weakness.  Psychiatric/Behavioral: Positive for depression. The patient is nervous/anxious.    As per HPI. Otherwise, a complete review of systems is negative.    PAST MEDICAL HISTORY: Past Medical History:  Diagnosis Date  . Breast cancer ([facility]) 2011   LT BREAST LUMPECTOMY  . Dog bite of face 04-04-15  . Obesity, unspecified 2013  . Personal history of chemotherapy 2011   BREAST CA  . Personal history of radiation therapy 2011   BREAST CA  . Personal history of tobacco use, presenting hazards to health 2013    PAST SURGICAL HISTORY: Past Surgical History:  Procedure  Laterality Date  . BREAST EXCISIONAL BIOPSY Left 2011   positive  . BREAST LUMPECTOMY Left 2011   BREAST CA  . BREAST SURGERY Left 2011   lumpectomy radiation and chemo.  Marland Kitchen CESAREAN SECTION  1989  . PORTACATH PLACEMENT  2011    FAMILY HISTORY: Family History  Problem Relation Age of Onset  . Family history unknown: Yes       ADVANCED DIRECTIVES (Y/N):  N   HEALTH MAINTENANCE: Social History  Substance Use Topics  . Smoking status: Former Smoker    Years: 2.00    Types: Cigarettes  . Smokeless tobacco: Never Used     Comment: 1-2 cigarettes a day  . Alcohol use No     Colonoscopy:  PAP:  Bone density:  Lipid panel:  Allergies  Allergen Reactions  . Aleve [Naproxen Sodium] Other (See Comments)    Stomach pain/gas pains/upset stomach    Current Outpatient Prescriptions  Medication Sig Dispense Refill  . doxycycline (VIBRA-TABS) 100 MG tablet Take 1 tablet (100 mg total) by mouth 2 (two) times daily. 20 tablet 0  . tamoxifen (NOLVADEX) 20 MG tablet Take 1 tablet (20 mg total) by mouth daily. (Patient not taking: Reported on 10/14/2016) 30 tablet 6   No current facility-administered medications for this visit.     OBJECTIVE: There were no vitals filed for this visit.   There is no height or weight on file to calculate BMI.    ECOG FS:0 - Asymptomatic  General: Well-developed, well-nourished, no acute distress. Eyes: Pink conjunctiva, anicteric sclera. Breasts: Patient refused exam today. Lungs: Clear to auscultation bilaterally. Heart: Regular rate and rhythm. No rubs, murmurs, or gallops. Abdomen: Soft, nontender, nondistended. No  organomegaly noted, normoactive bowel sounds. Musculoskeletal: No edema, cyanosis, or clubbing. Neuro: Alert, answering all questions appropriately. Cranial nerves grossly intact. Skin: No rashes or petechiae noted. Psych: Normal affect.   LAB RESULTS:  Lab Results  Component Value Date   NA 131 (L) 04/15/2016   K 3.6  04/15/2016   CL 101 04/15/2016   CO2 24 04/15/2016   GLUCOSE 205 (H) 04/15/2016   BUN 9 04/15/2016   CREATININE 0.59 04/15/2016   CALCIUM 8.8 (L) 04/15/2016   PROT 7.3 04/15/2016   ALBUMIN 4.6 04/15/2016   AST 39 04/15/2016   ALT 39 04/15/2016   ALKPHOS 77 04/15/2016   BILITOT 1.7 (H) 04/15/2016   GFRNONAA >60 04/15/2016   GFRAA >60 04/15/2016    Lab Results  Component Value Date   WBC 6.8 04/15/2016   NEUTROABS 4.4 04/15/2016   HGB 14.1 04/15/2016   HCT 39.0 04/15/2016   MCV 91.2 04/15/2016   PLT 262 04/15/2016     STUDIES: No results found.  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  PLAN:    1. 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 She completed neoadjuvant chemotherapy in August 2011. Her subsequent lumpectomy revealed decrease tumor burden with the above stated pathology. She completed her XRT in December 2011. Patient initiated tamoxifen in December 2011, but did not complete 5 years of treatment. Her most recent mammogram on April 06, 2016 was recorded as BI-RADS 0. Despite recommendations for additional imaging, patient was unable to get a MRI or repeat mammogram. Will get a mammogram in the next 1-2 weeks to further evaluate.  Return to clinic in 6 months for further evaluation unless her mammogram is suspicious. She will then return to clinic sooner to discuss the results.  Approximately 30 minutes was spent in discussion of which greater than 50% was consultation.    Patient expressed understanding and was in agreement with this plan. She also understands that She can call clinic at any time with any questions, concerns, or complaints.   Cancer Staging Primary cancer of upper outer quadrant of left female breast [facility]) Staging form: Breast, AJCC 7th Edition - Pathologic stage from  04/18/2016: Stage IA (T1c, N0, cM0) - Signed by Lloyd Huger, MD on 04/18/2016   Lloyd Huger, MD   04/13/2017 9:44 PM

## 2017-04-15 ENCOUNTER — Inpatient Hospital Stay: Payer: Medicaid Other | Admitting: Oncology

## 2017-04-15 ENCOUNTER — Ambulatory Visit: Payer: Medicaid Other | Admitting: Oncology

## 2017-05-16 NOTE — Progress Notes (Deleted)
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  INTERVAL HISTORY: Patient returns to clinic today for routine 6 month follow-up. She has multiple medical complaints that are chronic and unchanged in nature. She has no neurologic complaints. She denies any recent fevers or illnesses. She has no chest pain or shortness of breath. She denies any nausea, vomiting, constipation, or diarrhea. She has no urinary complaint. Patient otherwise feels well and offers no further specific complaints.  REVIEW OF SYSTEMS:   Review of Systems  Constitutional: Positive for malaise/fatigue. Negative for fever and weight loss.  Respiratory: Positive for shortness of breath. Negative for cough.   Cardiovascular: Negative.  Negative for chest pain and leg swelling.  Gastrointestinal: Negative.  Negative for abdominal pain.  Genitourinary: Negative.   Musculoskeletal: Positive for back pain and joint pain.  Neurological: Positive for weakness.  Psychiatric/Behavioral: Positive for depression. The patient is nervous/anxious.    As per HPI. Otherwise, a complete review of systems is negative.    PAST MEDICAL HISTORY: Past Medical History:  Diagnosis Date  . Breast cancer ([place]) 2011   LT BREAST LUMPECTOMY  . Dog bite of face 04-04-15  . Obesity, unspecified 2013  . Personal history of chemotherapy 2011   BREAST CA  . Personal history of radiation therapy 2011   BREAST CA  . Personal history of tobacco use, presenting hazards to health 2013    PAST SURGICAL HISTORY: Past Surgical History:  Procedure  Laterality Date  . BREAST EXCISIONAL BIOPSY Left 2011   positive  . BREAST LUMPECTOMY Left 2011   BREAST CA  . BREAST SURGERY Left 2011   lumpectomy radiation and chemo.  Marland Kitchen CESAREAN SECTION  1989  . PORTACATH PLACEMENT  2011    FAMILY HISTORY: Family History  Problem Relation Age of Onset  . Family history unknown: Yes       ADVANCED DIRECTIVES (Y/N):  N   HEALTH MAINTENANCE: Social History  Substance Use Topics  . Smoking status: Former Smoker    Years: 2.00    Types: Cigarettes  . Smokeless tobacco: Never Used     Comment: 1-2 cigarettes a day  . Alcohol use No     Colonoscopy:  PAP:  Bone density:  Lipid panel:  Allergies  Allergen Reactions  . Aleve [Naproxen Sodium] Other (See Comments)    Stomach pain/gas pains/upset stomach    Current Outpatient Prescriptions  Medication Sig Dispense Refill  . doxycycline (VIBRA-TABS) 100 MG tablet Take 1 tablet (100 mg total) by mouth 2 (two) times daily. 20 tablet 0  . tamoxifen (NOLVADEX) 20 MG tablet Take 1 tablet (20 mg total) by mouth daily. (Patient not taking: Reported on 10/14/2016) 30 tablet 6   No current facility-administered medications for this visit.     OBJECTIVE: There were no vitals filed for this visit.   There is no height or weight on file to calculate BMI.    ECOG FS:0 - Asymptomatic  General: Well-developed, well-nourished, no acute distress. Eyes: Pink conjunctiva, anicteric sclera. Breasts: Patient refused exam today. Lungs: Clear to auscultation bilaterally. Heart: Regular rate and rhythm. No rubs, murmurs, or gallops. Abdomen: Soft, nontender, nondistended. No  organomegaly noted, normoactive bowel sounds. Musculoskeletal: No edema, cyanosis, or clubbing. Neuro: Alert, answering all questions appropriately. Cranial nerves grossly intact. Skin: No rashes or petechiae noted. Psych: Normal affect.   LAB RESULTS:  Lab Results  Component Value Date   NA 131 (L) 04/15/2016   K 3.6  04/15/2016   CL 101 04/15/2016   CO2 24 04/15/2016   GLUCOSE 205 (H) 04/15/2016   BUN 9 04/15/2016   CREATININE 0.59 04/15/2016   CALCIUM 8.8 (L) 04/15/2016   PROT 7.3 04/15/2016   ALBUMIN 4.6 04/15/2016   AST 39 04/15/2016   ALT 39 04/15/2016   ALKPHOS 77 04/15/2016   BILITOT 1.7 (H) 04/15/2016   GFRNONAA >60 04/15/2016   GFRAA >60 04/15/2016    Lab Results  Component Value Date   WBC 6.8 04/15/2016   NEUTROABS 4.4 04/15/2016   HGB 14.1 04/15/2016   HCT 39.0 04/15/2016   MCV 91.2 04/15/2016   PLT 262 04/15/2016     STUDIES: No results found.  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  PLAN:    1. 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 She completed neoadjuvant chemotherapy in August 2011. Her subsequent lumpectomy revealed decrease tumor burden with the above stated pathology. She completed her XRT in December 2011. Patient initiated tamoxifen in December 2011, but did not complete 5 years of treatment. Her most recent mammogram on April 06, 2016 was recorded as BI-RADS 0. Despite recommendations for additional imaging, patient was unable to get a MRI or repeat mammogram. Will get a mammogram in the next 1-2 weeks to further evaluate.  Return to clinic in 6 months for further evaluation unless her mammogram is suspicious. She will then return to clinic sooner to discuss the results.  Approximately 30 minutes was spent in discussion of which greater than 50% was consultation.    Patient expressed understanding and was in agreement with this plan. She also understands that She can call clinic at any time with any questions, concerns, or complaints.   Cancer Staging Primary cancer of upper outer quadrant of left female breast [facility]) Staging form: Breast, AJCC 7th Edition - Pathologic stage from  04/18/2016: Stage IA (T1c, N0, cM0) - Signed by Lloyd Huger, MD on 04/18/2016   Lloyd Huger, MD   05/16/2017 9:31 PM

## 2017-05-17 ENCOUNTER — Inpatient Hospital Stay: Payer: Self-pay | Admitting: Oncology

## 2017-05-30 NOTE — Progress Notes (Deleted)
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  INTERVAL HISTORY: Patient returns to clinic today for routine 6 month follow-up. She has multiple medical complaints that are chronic and unchanged in nature. She has no neurologic complaints. She denies any recent fevers or illnesses. She has no chest pain or shortness of breath. She denies any nausea, vomiting, constipation, or diarrhea. She has no urinary complaint. Patient otherwise feels well and offers no further specific complaints.  REVIEW OF SYSTEMS:   Review of Systems  Constitutional: Positive for malaise/fatigue. Negative for fever and weight loss.  Respiratory: Positive for shortness of breath. Negative for cough.   Cardiovascular: Negative.  Negative for chest pain and leg swelling.  Gastrointestinal: Negative.  Negative for abdominal pain.  Genitourinary: Negative.   Musculoskeletal: Positive for back pain and joint pain.  Neurological: Positive for weakness.  Psychiatric/Behavioral: Positive for depression. The patient is nervous/anxious.    As per HPI. Otherwise, a complete review of systems is negative.    PAST MEDICAL HISTORY: Past Medical History:  Diagnosis Date  . Breast cancer ([place]) 2011   LT BREAST LUMPECTOMY  . Dog bite of face 04-04-15  . Obesity, unspecified 2013  . Personal history of chemotherapy 2011   BREAST CA  . Personal history of radiation therapy 2011   BREAST CA  . Personal history of tobacco use, presenting hazards to health 2013    PAST SURGICAL HISTORY: Past Surgical History:  Procedure  Laterality Date  . BREAST EXCISIONAL BIOPSY Left 2011   positive  . BREAST LUMPECTOMY Left 2011   BREAST CA  . BREAST SURGERY Left 2011   lumpectomy radiation and chemo.  Marland Kitchen CESAREAN SECTION  1989  . PORTACATH PLACEMENT  2011    FAMILY HISTORY: Family History  Problem Relation Age of Onset  . Family history unknown: Yes       ADVANCED DIRECTIVES (Y/N):  N   HEALTH MAINTENANCE: Social History  Substance Use Topics  . Smoking status: Former Smoker    Years: 2.00    Types: Cigarettes  . Smokeless tobacco: Never Used     Comment: 1-2 cigarettes a day  . Alcohol use No     Colonoscopy:  PAP:  Bone density:  Lipid panel:  Allergies  Allergen Reactions  . Aleve [Naproxen Sodium] Other (See Comments)    Stomach pain/gas pains/upset stomach    Current Outpatient Prescriptions  Medication Sig Dispense Refill  . doxycycline (VIBRA-TABS) 100 MG tablet Take 1 tablet (100 mg total) by mouth 2 (two) times daily. 20 tablet 0  . tamoxifen (NOLVADEX) 20 MG tablet Take 1 tablet (20 mg total) by mouth daily. (Patient not taking: Reported on 10/14/2016) 30 tablet 6   No current facility-administered medications for this visit.     OBJECTIVE: There were no vitals filed for this visit.   There is no height or weight on file to calculate BMI.    ECOG FS:0 - Asymptomatic  General: Well-developed, well-nourished, no acute distress. Eyes: Pink conjunctiva, anicteric sclera. Breasts: Patient refused exam today. Lungs: Clear to auscultation bilaterally. Heart: Regular rate and rhythm. No rubs, murmurs, or gallops. Abdomen: Soft, nontender, nondistended. No  organomegaly noted, normoactive bowel sounds. Musculoskeletal: No edema, cyanosis, or clubbing. Neuro: Alert, answering all questions appropriately. Cranial nerves grossly intact. Skin: No rashes or petechiae noted. Psych: Normal affect.   LAB RESULTS:  Lab Results  Component Value Date   NA 131 (L) 04/15/2016   K 3.6  04/15/2016   CL 101 04/15/2016   CO2 24 04/15/2016   GLUCOSE 205 (H) 04/15/2016   BUN 9 04/15/2016   CREATININE 0.59 04/15/2016   CALCIUM 8.8 (L) 04/15/2016   PROT 7.3 04/15/2016   ALBUMIN 4.6 04/15/2016   AST 39 04/15/2016   ALT 39 04/15/2016   ALKPHOS 77 04/15/2016   BILITOT 1.7 (H) 04/15/2016   GFRNONAA >60 04/15/2016   GFRAA >60 04/15/2016    Lab Results  Component Value Date   WBC 6.8 04/15/2016   NEUTROABS 4.4 04/15/2016   HGB 14.1 04/15/2016   HCT 39.0 04/15/2016   MCV 91.2 04/15/2016   PLT 262 04/15/2016     STUDIES: No results found.  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  PLAN:    1. 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 She completed neoadjuvant chemotherapy in August 2011. Her subsequent lumpectomy revealed decrease tumor burden with the above stated pathology. She completed her XRT in December 2011. Patient initiated tamoxifen in December 2011, but did not complete 5 years of treatment. Her most recent mammogram on April 06, 2016 was recorded as BI-RADS 0. Despite recommendations for additional imaging, patient was unable to get a MRI or repeat mammogram. Will get a mammogram in the next 1-2 weeks to further evaluate.  Return to clinic in 6 months for further evaluation unless her mammogram is suspicious. She will then return to clinic sooner to discuss the results.  Approximately 30 minutes was spent in discussion of which greater than 50% was consultation.    Patient expressed understanding and was in agreement with this plan. She also understands that She can call clinic at any time with any questions, concerns, or complaints.   Cancer Staging Primary cancer of upper outer quadrant of left female breast [facility]) Staging form: Breast, AJCC 7th Edition - Pathologic stage from  04/18/2016: Stage IA (T1c, N0, cM0) - Signed by Lloyd Huger, MD on 04/18/2016   Lloyd Huger, MD   05/30/2017 9:00 AM

## 2017-05-31 ENCOUNTER — Inpatient Hospital Stay: Payer: Self-pay | Admitting: Oncology

## 2017-07-26 ENCOUNTER — Other Ambulatory Visit: Payer: Self-pay

## 2017-07-26 ENCOUNTER — Emergency Department: Payer: Self-pay

## 2017-07-26 ENCOUNTER — Emergency Department
Admission: EM | Admit: 2017-07-26 | Discharge: 2017-07-26 | Disposition: A | Discharge status: Home / Self Care | Payer: Self-pay | Attending: Emergency Medicine | Admitting: Emergency Medicine

## 2017-07-26 DIAGNOSIS — Z87891 Personal history of nicotine dependence: Secondary | ICD-10-CM | POA: Insufficient documentation

## 2017-07-26 DIAGNOSIS — R42 Dizziness and giddiness: Secondary | ICD-10-CM | POA: Insufficient documentation

## 2017-07-26 DIAGNOSIS — R112 Nausea with vomiting, unspecified: Secondary | ICD-10-CM | POA: Insufficient documentation

## 2017-07-26 DIAGNOSIS — Z853 Personal history of malignant neoplasm of breast: Secondary | ICD-10-CM | POA: Insufficient documentation

## 2017-07-26 LAB — URINALYSIS, COMPLETE (UACMP) WITH MICROSCOPIC
BACTERIA UA: NONE SEEN
BILIRUBIN URINE: NEGATIVE
Glucose, UA: >=500 mg/dL — AB
KETONES UR: NEGATIVE mg/dL
NITRITE: NEGATIVE
PROTEIN: NEGATIVE mg/dL
SPECIFIC GRAVITY, URINE: 1.015 (ref 1.005–1.030)
pH: 7 (ref 5.0–8.0)

## 2017-07-26 LAB — CBC WITH DIFFERENTIAL/PLATELET
BASOS ABS: 0 10*3/uL (ref 0–0.1)
BASOS PCT: 1 %
Eosinophils Absolute: 0.1 10*3/uL (ref 0–0.7)
Eosinophils Relative: 1 %
HEMATOCRIT: 39.9 % (ref 35.0–47.0)
HEMOGLOBIN: 13.9 g/dL (ref 12.0–16.0)
Lymphocytes Relative: 14 %
Lymphs Abs: 0.9 10*3/uL — ABNORMAL LOW (ref 1.0–3.6)
MCH: 31.3 pg (ref 26.0–34.0)
MCHC: 34.8 g/dL (ref 32.0–36.0)
MCV: 90 fL (ref 80.0–100.0)
Monocytes Absolute: 0.2 10*3/uL (ref 0.2–0.9)
Monocytes Relative: 4 %
NEUTROS ABS: 5.4 10*3/uL (ref 1.4–6.5)
NEUTROS PCT: 80 %
Platelets: 238 10*3/uL (ref 150–440)
RBC: 4.44 MIL/uL (ref 3.80–5.20)
RDW: 12.8 % (ref 11.5–14.5)
WBC: 6.7 10*3/uL (ref 3.6–11.0)

## 2017-07-26 LAB — COMPREHENSIVE METABOLIC PANEL
ALBUMIN: 4 g/dL (ref 3.5–5.0)
ALK PHOS: 96 U/L (ref 38–126)
ALT: 20 U/L (ref 14–54)
ANION GAP: 10 (ref 5–15)
AST: 25 U/L (ref 15–41)
BILIRUBIN TOTAL: 1 mg/dL (ref 0.3–1.2)
BUN: 8 mg/dL (ref 6–20)
CO2: 23 mmol/L (ref 22–32)
Calcium: 8.9 mg/dL (ref 8.9–10.3)
Chloride: 104 mmol/L (ref 101–111)
Creatinine, Ser: 0.59 mg/dL (ref 0.44–1.00)
GFR calc Af Amer: >60 mL/min (ref >60)
GFR calc non Af Amer: >60 mL/min (ref >60)
GLUCOSE: 209 mg/dL — AB (ref 65–99)
POTASSIUM: 4.1 mmol/L (ref 3.5–5.1)
SODIUM: 137 mmol/L (ref 135–145)
TOTAL PROTEIN: 7.1 g/dL (ref 6.5–8.1)

## 2017-07-26 LAB — TROPONIN I: Troponin I: <0.03 ng/mL (ref <0.03)

## 2017-07-26 MED ORDER — SODIUM CHLORIDE 0.9 % IV SOLN
Freq: Once | INTRAVENOUS | Status: AC
Start: 2017-07-26 — End: 2017-07-26
  Administered 2017-07-26: 10:00:00 via INTRAVENOUS

## 2017-07-26 MED ORDER — METOCLOPRAMIDE HCL 5 MG/ML IJ SOLN
10.0000 mg | Freq: Once | INTRAMUSCULAR | Status: AC
  Administered 2017-07-26: 10 mg via INTRAVENOUS
  Filled 2017-07-26: qty 2

## 2017-07-26 MED ORDER — ONDANSETRON HCL 4 MG/2ML IJ SOLN
4.0000 mg | Freq: Once | INTRAMUSCULAR | Status: AC
  Administered 2017-07-26: 4 mg via INTRAVENOUS
  Filled 2017-07-26: qty 2

## 2017-07-26 MED ORDER — SULFAMETHOXAZOLE-TRIMETHOPRIM 800-160 MG PO TABS: 1.0000 | ORAL_TABLET | Freq: Two times a day (BID) | ORAL | 0 refills | Status: DC

## 2017-07-26 MED ORDER — ONDANSETRON 4 MG PO TBDP: 4.0000 mg | ORAL_TABLET | Freq: Three times a day (TID) | ORAL | 0 refills | Status: DC | PRN

## 2017-07-26 NOTE — ED Triage Notes (Signed)
Pt comes into the ED via EMS from daughter N laws home with c/o waking up early this morning with dizziness N/V.. EMS gave 4mg  Zofran in route. Pt is a/ox4 on arrival, in NAD.Marland Kitchen

## 2017-07-26 NOTE — ED Provider Notes (Signed)
[facility] Emergency Department Provider Note       Time seen: ----------------------------------------- 9:18 AM on 07/26/2017 -----------------------------------------   I have reviewed the triage vital signs and the nursing notes.  HISTORY   Chief Complaint No chief complaint on file.    HPI Sonya Perry is a 51 y.o. female with a history of breast cancer who presents to the ED for nausea and vomiting today. Patient had dizziness with subsequent vomiting up to 5 times. Patient states dizziness started before the vomiting but was not described as room spinning. She has not had diarrhea and does not have abdominal pain. She denies fevers, chills or other complaints.  Past Medical History:  Diagnosis Date  . Breast cancer ([place]) 2011   LT BREAST LUMPECTOMY  . Dog bite of face 04-04-15  . Obesity, unspecified 2013  . Personal history of chemotherapy 2011   BREAST CA  . Personal history of radiation therapy 2011   BREAST CA  . Personal history of tobacco use, presenting hazards to health 2013    Patient Active Problem List   Diagnosis Date Noted  . History of breast cancer 05/02/2013  . Primary cancer of upper outer quadrant of left female breast [facility])     Past Surgical History:  Procedure Laterality Date  . BREAST EXCISIONAL BIOPSY Left 2011   positive  . BREAST LUMPECTOMY Left 2011   BREAST CA  . BREAST SURGERY Left 2011   lumpectomy radiation and chemo.  Marland Kitchen CESAREAN SECTION  1989  . PORTACATH PLACEMENT  2011    Allergies Aleve [naproxen sodium]  Social History Social History   Tobacco Use  . Smoking status: Former Smoker    Years: 2.00    Types: Cigarettes  . Smokeless tobacco: Never Used  . Tobacco comment: 1-2 cigarettes a day  Substance Use Topics  . Alcohol use: No    Alcohol/week: 0.0 oz  . Drug use: No    Review of Systems Constitutional: Negative for fever. Eyes: Negative for vision changes ENT:  Negative  for congestion, sore throat Cardiovascular: Negative for chest pain. Respiratory: Negative for shortness of breath. Gastrointestinal: Negative for abdominal pain, positive for vomiting Genitourinary: Negative for dysuria. Musculoskeletal: Negative for back pain. Skin: Negative for rash. Neurological: Negative for headaches, focal weakness or numbness.  All systems negative/normal/unremarkable except as stated in the HPI  ____________________________________________   PHYSICAL EXAM:  VITAL SIGNS: ED Triage Vitals  Enc Vitals Group     BP      Pulse      Resp      Temp      Temp src      SpO2      Weight      Height      Head Circumference      Peak Flow      Pain Score      Pain Loc      Pain Edu?      Excl. in [place]?     Constitutional: Alert and oriented. Well appearing and in no distress. Eyes: Conjunctivae are normal. Normal extraocular movements. ENT   Head: Normocephalic and atraumatic.   Nose: No congestion/rhinnorhea.   Mouth/Throat: Mucous membranes are moist.   Neck: No stridor. Cardiovascular: Normal rate, regular rhythm. No murmurs, rubs, or gallops. Respiratory: Normal respiratory effort without tachypnea nor retractions. Breath sounds are clear and equal bilaterally. No wheezes/rales/rhonchi. Gastrointestinal: Soft and nontender. Normal bowel sounds Musculoskeletal: Nontender with normal range of motion  in extremities. No lower extremity tenderness nor edema. Neurologic:  Normal speech and language. No gross focal neurologic deficits are appreciated.  Skin:  Skin is warm, dry and intact. No rash noted. Psychiatric: Mood and affect are normal. Speech and behavior are normal.  ____________________________________________  ED COURSE:  Pertinent labs & imaging results that were available during my care of the patient were reviewed by me and considered in my medical decision making (see chart for details). Patient presents for dizziness and  vomiting, we will assess with labs and imaging as indicated.   Procedures ____________________________________________   LABS (pertinent positives/negatives)  Labs Reviewed  CBC WITH DIFFERENTIAL/PLATELET - Abnormal; Notable for the following components:      Result Value   Lymphs Abs 0.9 (*)    All other components within normal limits  COMPREHENSIVE METABOLIC PANEL - Abnormal; Notable for the following components:   Glucose, Bld 209 (*)    All other components within normal limits  URINALYSIS, COMPLETE (UACMP) WITH MICROSCOPIC - Abnormal; Notable for the following components:   Color, Urine YELLOW (*)    APPearance HAZY (*)    Glucose, UA >=500 (*)    Hgb urine dipstick SMALL (*)    Leukocytes, UA LARGE (*)    Squamous Epithelial / LPF 0-5 (*)    All other components within normal limits  TROPONIN I  ____________________________________________  DIFFERENTIAL DIAGNOSIS   Gastroenteritis, dehydration, electrolyte abnormality, vertigo, arrhythmia  FINAL ASSESSMENT AND PLAN  Dizziness, vomiting   Plan: Patient had presented for dizziness and vomiting. Patient's labs were negative except for possible UTI and borderline hyperglycemia. She was given fluids as well as antiemetics. I will give her antibiotics and antiemetics for home and she is stable for outpatient follow-up.   Earleen Newport, MD   Note: This note was generated in part or whole with voice recognition software. Voice recognition is usually quite accurate but there are transcription errors that can and very often do occur. I apologize for any typographical errors that were not detected and corrected.     Earleen Newport, MD 07/26/17 [phone]

## 2017-07-26 NOTE — ED Notes (Signed)
Pt verbalizes d/c teaching and RX. PT in NAD at time of d/c, pt ambulatory. Pt waiting for family in lobby

## 2018-02-14 ENCOUNTER — Emergency Department: Payer: Self-pay

## 2018-02-14 ENCOUNTER — Encounter: Payer: Self-pay | Admitting: Emergency Medicine

## 2018-02-14 ENCOUNTER — Emergency Department
Admission: EM | Admit: 2018-02-14 | Discharge: 2018-02-14 | Disposition: A | Discharge status: Home / Self Care | Payer: Self-pay | Attending: Emergency Medicine | Admitting: Emergency Medicine

## 2018-02-14 DIAGNOSIS — Z87891 Personal history of nicotine dependence: Secondary | ICD-10-CM | POA: Insufficient documentation

## 2018-02-14 DIAGNOSIS — Z923 Personal history of irradiation: Secondary | ICD-10-CM | POA: Insufficient documentation

## 2018-02-14 DIAGNOSIS — L240 Irritant contact dermatitis due to detergents: Secondary | ICD-10-CM | POA: Insufficient documentation

## 2018-02-14 DIAGNOSIS — R0789 Other chest pain: Secondary | ICD-10-CM

## 2018-02-14 DIAGNOSIS — Z853 Personal history of malignant neoplasm of breast: Secondary | ICD-10-CM | POA: Insufficient documentation

## 2018-02-14 DIAGNOSIS — Z9221 Personal history of antineoplastic chemotherapy: Secondary | ICD-10-CM | POA: Insufficient documentation

## 2018-02-14 DIAGNOSIS — R071 Chest pain on breathing: Secondary | ICD-10-CM | POA: Insufficient documentation

## 2018-02-14 MED ORDER — CLOBETASOL PROP EMOLLIENT BASE 0.05 % EX CREA: 1.0000 "application " | TOPICAL_CREAM | Freq: Four times a day (QID) | CUTANEOUS | 0 refills | Status: AC

## 2018-02-14 MED ORDER — TRAMADOL HCL 50 MG PO TABS: 50.0000 mg | ORAL_TABLET | Freq: Four times a day (QID) | ORAL | 0 refills | Status: DC | PRN

## 2018-02-14 MED ORDER — BACLOFEN 10 MG PO TABS: 10.0000 mg | ORAL_TABLET | Freq: Three times a day (TID) | ORAL | 0 refills | Status: DC

## 2018-02-14 NOTE — ED Triage Notes (Signed)
Pt c/o thoracic back pain on the right side, worse when pt lays down. Pt also asking to have bilateral hands checked due to them being dry and cracked. Pt denies chest pain and SOB.

## 2018-02-14 NOTE — ED Notes (Signed)
Pt c/o lower back pain and right side pain x1 week. Pt reports being unable to lay comfortably without sharp back pain. Pt denies any injury to her back.

## 2018-02-14 NOTE — Discharge Instructions (Signed)
Wear long gloves while washing dishes. Keep your hands as dry as possible.  Follow up with your primary care provider in 1 week or sooner if you feel worse.

## 2018-02-14 NOTE — ED Provider Notes (Signed)
[facility] Emergency Department Provider Note  ____________________________________________   First MD Initiated Contact with Patient 02/14/18 1929     (approximate)  I have reviewed the triage vital signs and the nursing notes.   HISTORY  Chief Complaint Back Pain   HPI Sonya Perry is a 52 y.o. female who presents to the emergency department for treatment and evaluation of right side thoracic pain and skin on hands cracking and peeling.  Pain in the right thorax is worse with any movement, lying on that side, coughing, sneezing, or taking a deep breath.  This started over a week ago.  Patient is very concerned that she "has cancer again."  She had breast cancer in the left breast in 2011 which was treated with chemo and radiation and cleared.  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  No alleviating measures have been attempted for either of these complaints prior to arrival.  Past Medical History:  Diagnosis Date  . Breast cancer ([place]) 2011   LT BREAST LUMPECTOMY  . Dog bite of face 04-04-15  . Obesity, unspecified 2013  . Personal history of chemotherapy 2011   BREAST CA  . Personal history of radiation therapy 2011   BREAST CA  . Personal history of tobacco use, presenting hazards to health 2013    Patient Active Problem List   Diagnosis Date Noted  . History of breast cancer 05/02/2013  . Primary cancer of upper outer quadrant of left female breast [facility])     Past Surgical History:  Procedure Laterality Date  . BREAST EXCISIONAL BIOPSY Left 2011   positive  . BREAST LUMPECTOMY Left 2011   BREAST CA  . BREAST SURGERY Left 2011   lumpectomy radiation and chemo.  Marland Kitchen CESAREAN SECTION  1989  . PORTACATH PLACEMENT  2011    Prior to Admission medications   Medication Sig Start Date End Date Taking? Authorizing Provider  baclofen (LIORESAL) 10 MG tablet Take 1 tablet (10 mg  total) by mouth 3 (three) times daily. 02/14/18   Sherrie George B, FNP  Clobetasol Prop Emollient Base 0.05 % emollient cream Apply 1 application topically QID. 02/14/18 03/16/18  Santina Trillo B, FNP  ibuprofen (ADVIL,MOTRIN) 200 MG tablet Take 200 mg by mouth every 6 (six) hours as needed for mild pain or moderate pain.    [provider]  ondansetron (ZOFRAN ODT) 4 MG disintegrating tablet Take 1 tablet (4 mg total) by mouth every 8 (eight) hours as needed for nausea or vomiting. 07/26/17   Earleen Newport, MD  sulfamethoxazole-trimethoprim (BACTRIM DS) 800-160 MG tablet Take 1 tablet by mouth 2 (two) times daily. 07/26/17   Earleen Newport, MD  traMADol (ULTRAM) 50 MG tablet Take 1 tablet (50 mg total) by mouth every 6 (six) hours as needed. 02/14/18   Victorino Dike, FNP    Allergies Aleve [naproxen sodium]  Family History  Family history unknown: Yes    Social History Social History   Tobacco Use  . Smoking status: Former Smoker    Years: 2.00    Types: Cigarettes  . Smokeless tobacco: Never Used  . Tobacco comment: 1-2 cigarettes a day  Substance Use Topics  . Alcohol use: No    Alcohol/week: 0.0 oz  . Drug use: No    Review of Systems  Constitutional: No fever/chills Eyes: No visual changes. ENT: No sore throat. Cardiovascular: Denies chest pain. Respiratory: Denies  shortness of breath. Gastrointestinal: No abdominal pain.  No nausea, no vomiting.  No diarrhea.  No constipation. Genitourinary: Negative for dysuria. Musculoskeletal: Positive for right side lateral and posterior thoracic pain reproducible with movement. Skin: Positive for deep skin fissures and peeling on the palmar and dorsal aspects of both hands.   Neurological: Negative for headaches, focal weakness or numbness. ____________________________________________   PHYSICAL EXAM:  VITAL SIGNS: ED Triage Vitals  Enc Vitals Group     BP 02/14/18 1916 (!) 171/96     Pulse Rate  02/14/18 1916 83     Resp 02/14/18 1916 17     Temp 02/14/18 1916 98.1 F (36.7 C)     Temp Source 02/14/18 1916 Oral     SpO2 02/14/18 1916 99 %     Weight 02/14/18 1916 160 lb (72.6 kg)     Height --      Head Circumference --      Peak Flow --      Pain Score 02/14/18 2117 0     Pain Loc --      Pain Edu? --      Excl. in [place]? --     Constitutional: Alert and oriented.  Unkempt in appearance Eyes: Conjunctivae are normal. Head: Atraumatic. Nose: No congestion/rhinnorhea. Mouth/Throat: Mucous membranes are moist. Neck: No stridor.   Cardiovascular: Normal rate, regular rhythm. Grossly normal heart sounds.  Good peripheral circulation. Respiratory: Normal respiratory effort.  No retractions. Lungs CTAB. Gastrointestinal: Soft and nontender. No distention.  Musculoskeletal: No lower extremity tenderness nor edema.  No joint effusions. Neurologic:  Normal speech and language. No gross focal neurologic deficits are appreciated. No gait instability. Skin: Dorsal aspect of both hands with scaling plaques. Palmar aspect of both hands hypertrophic with deep skin fissures. No obvious secondary infection.  ____________________________________________   LABS (all labs ordered are listed, but only abnormal results are displayed)  Labs Reviewed - No data to display ____________________________________________  EKG  Not indicated. ____________________________________________  RADIOLOGY  ED MD interpretation: Chest x-ray negative for acute cardiopulmonary abnormality per radiology.  Official radiology report(s): Dg Chest 2 View  Result Date: 02/14/2018 CLINICAL DATA:  Right chest pain, history of breast cancer EXAM: CHEST - 2 VIEW COMPARISON:  10/29/2009 chest radiograph. FINDINGS: Stable cardiomediastinal silhouette with normal heart size. No pneumothorax. No pleural effusion. Lungs appear clear, with no acute consolidative airspace disease and no pulmonary edema. Healed deformity  in the anterior left seventh rib. IMPRESSION: No active cardiopulmonary disease. Electronically Signed   By: Ilona Sorrel M.D.   On: 02/14/2018 20:35    ____________________________________________   PROCEDURES  Procedure(s) performed: None  Procedures  Critical Care performed: No  ____________________________________________   INITIAL IMPRESSION / ASSESSMENT AND PLAN / ED COURSE  As part of my medical decision making, I reviewed the following data within the electronic MEDICAL RECORD NUMBER    52 year old female presenting to the emergency department for treatment and evaluation of right lateral and posterior thoracic pain as well as skin fissuring and peeling on bilateral hands.  Chest x-ray is reassuring and the patient was advised that there is no obvious infection or masses that are identified tonight.  She will be given baclofen and tramadol to control pain of what is most consistent with costochondritis.  She was strongly advised to wear long gloves while at work and to keep her hands as dry as possible.  She will be given a prescription for clobetasol and advised to use it 4 times  per day.  She was instructed to call and schedule follow-up appointment with her primary care provider for next week.  She was encouraged to return to the emergency department for symptoms of change or worsen if she is unable to schedule an appointment.      ____________________________________________   FINAL CLINICAL IMPRESSION(S) / ED DIAGNOSES  Final diagnoses:  Irritant contact dermatitis due to detergents  Costochondral chest pain     ED Discharge Orders        Ordered    Clobetasol Prop Emollient Base 0.05 % emollient cream  4 times daily     02/14/18 2109    traMADol (ULTRAM) 50 MG tablet  Every 6 hours PRN     02/14/18 2109    baclofen (LIORESAL) 10 MG tablet  3 times daily     02/14/18 2109       Note:  This document was prepared using Dragon voice recognition software and may  include unintentional dictation errors.    Victorino Dike, FNP 02/14/18 2210    Nance Pear, MD 02/14/18 2218

## 2018-02-17 NOTE — ED Notes (Signed)
02/17/2018--1542--Sonya Perry had left message saying patientis unable to afford the clabetosol cream and asked if we could change to something like triamsinolone cream 1%.  Per dr  Alfred Levins we can change it with same instructions.

## 2018-04-11 ENCOUNTER — Encounter: Payer: Self-pay | Admitting: Emergency Medicine

## 2018-04-11 ENCOUNTER — Emergency Department
Admission: EM | Admit: 2018-04-11 | Discharge: 2018-04-11 | Disposition: A | Discharge status: Home / Self Care | Payer: Medicaid Other | Attending: Emergency Medicine | Admitting: Emergency Medicine

## 2018-04-11 DIAGNOSIS — Z853 Personal history of malignant neoplasm of breast: Secondary | ICD-10-CM | POA: Insufficient documentation

## 2018-04-11 DIAGNOSIS — R42 Dizziness and giddiness: Secondary | ICD-10-CM

## 2018-04-11 DIAGNOSIS — R112 Nausea with vomiting, unspecified: Secondary | ICD-10-CM | POA: Insufficient documentation

## 2018-04-11 DIAGNOSIS — R197 Diarrhea, unspecified: Secondary | ICD-10-CM | POA: Insufficient documentation

## 2018-04-11 DIAGNOSIS — Z87891 Personal history of nicotine dependence: Secondary | ICD-10-CM | POA: Insufficient documentation

## 2018-04-11 DIAGNOSIS — Z79899 Other long term (current) drug therapy: Secondary | ICD-10-CM | POA: Insufficient documentation

## 2018-04-11 LAB — CBC
HCT: 38.2 % (ref 35.0–47.0)
Hemoglobin: 13.9 g/dL (ref 12.0–16.0)
MCH: 32.8 pg (ref 26.0–34.0)
MCHC: 36.4 g/dL — ABNORMAL HIGH (ref 32.0–36.0)
MCV: 90.1 fL (ref 80.0–100.0)
Platelets: 299 10*3/uL (ref 150–440)
RBC: 4.24 MIL/uL (ref 3.80–5.20)
RDW: 13.1 % (ref 11.5–14.5)
WBC: 7.9 10*3/uL (ref 3.6–11.0)

## 2018-04-11 LAB — URINALYSIS, COMPLETE (UACMP) WITH MICROSCOPIC
BILIRUBIN URINE: NEGATIVE
GLUCOSE, UA: NEGATIVE mg/dL
HGB URINE DIPSTICK: NEGATIVE
Ketones, ur: NEGATIVE mg/dL
NITRITE: NEGATIVE
Protein, ur: NEGATIVE mg/dL
SPECIFIC GRAVITY, URINE: 1.019 (ref 1.005–1.030)
pH: 7 (ref 5.0–8.0)

## 2018-04-11 LAB — BASIC METABOLIC PANEL WITH GFR
Anion gap: 10 (ref 5–15)
BUN: 10 mg/dL (ref 6–20)
CO2: 25 mmol/L (ref 22–32)
Calcium: 9.4 mg/dL (ref 8.9–10.3)
Chloride: 104 mmol/L (ref 98–111)
Creatinine, Ser: 0.55 mg/dL (ref 0.44–1.00)
GFR calc Af Amer: >60 mL/min
GFR calc non Af Amer: >60 mL/min
Glucose, Bld: 121 mg/dL — ABNORMAL HIGH (ref 70–99)
Potassium: 3.7 mmol/L (ref 3.5–5.1)
Sodium: 139 mmol/L (ref 135–145)

## 2018-04-11 MED ORDER — SODIUM CHLORIDE 0.9 % IV BOLUS
1000.0000 mL | Freq: Once | INTRAVENOUS | Status: AC
  Administered 2018-04-11: 1000 mL via INTRAVENOUS

## 2018-04-11 MED ORDER — PROMETHAZINE HCL 25 MG/ML IJ SOLN
12.5000 mg | Freq: Once | INTRAMUSCULAR | Status: AC
  Administered 2018-04-11: 12.5 mg via INTRAVENOUS
  Filled 2018-04-11: qty 1

## 2018-04-11 MED ORDER — PROMETHAZINE HCL 25 MG RE SUPP: 25.0000 mg | Freq: Four times a day (QID) | RECTAL | 1 refills | Status: DC | PRN

## 2018-04-11 NOTE — ED Notes (Signed)
Pt ambulatory upon discharge; declined wheel chair. Verbalized understanding of discharge instructions, follow-up care and prescription. VSS. Skin warm and dry.  

## 2018-04-11 NOTE — ED Provider Notes (Addendum)
[facility] Emergency Department Provider Note  ____________________________________________   I have reviewed the triage vital signs and the nursing notes. Where available I have reviewed prior notes and, if possible and indicated, outside hospital notes.    HISTORY  Chief Complaint Emesis and Dizziness    HPI Sonya Perry is a 52 y.o. female who presents today complaining of nausea, vomiting, diarrhea, no bleeding no melena bright red blood per rectum.  States that she threw up 6 or 7 times this morning, had several episodes of diarrhea, and feels lightheaded.  Denies any focal numbness or weakness.  Asked several times for the remote control for the TV.  No abdominal pain or tenderness. Has had multiple episodes like this before has been seen in this emergency department for similar.  Denies any true vertigo, denies any other symptoms aside from positional lightheadedness after vomiting several times.  No headache.     Past Medical History:  Diagnosis Date  . Breast cancer ([place]) 2011   LT BREAST LUMPECTOMY  . Dog bite of face 04-04-15  . Obesity, unspecified 2013  . Personal history of chemotherapy 2011   BREAST CA  . Personal history of radiation therapy 2011   BREAST CA  . Personal history of tobacco use, presenting hazards to health 2013    Patient Active Problem List   Diagnosis Date Noted  . History of breast cancer 05/02/2013  . Primary cancer of upper outer quadrant of left female breast [facility])     Past Surgical History:  Procedure Laterality Date  . BREAST EXCISIONAL BIOPSY Left 2011   positive  . BREAST LUMPECTOMY Left 2011   BREAST CA  . BREAST SURGERY Left 2011   lumpectomy radiation and chemo.  Marland Kitchen CESAREAN SECTION  1989  . PORTACATH PLACEMENT  2011    Prior to Admission medications   Medication Sig Start Date End Date Taking? Authorizing Provider  baclofen (LIORESAL) 10 MG tablet Take 1 tablet (10 mg total) by mouth 3  (three) times daily. 02/14/18   Triplett, Johnette Abraham B, FNP  ibuprofen (ADVIL,MOTRIN) 200 MG tablet Take 200 mg by mouth every 6 (six) hours as needed for mild pain or moderate pain.    [provider]  ondansetron (ZOFRAN ODT) 4 MG disintegrating tablet Take 1 tablet (4 mg total) by mouth every 8 (eight) hours as needed for nausea or vomiting. 07/26/17   Earleen Newport, MD  sulfamethoxazole-trimethoprim (BACTRIM DS) 800-160 MG tablet Take 1 tablet by mouth 2 (two) times daily. 07/26/17   Earleen Newport, MD  traMADol (ULTRAM) 50 MG tablet Take 1 tablet (50 mg total) by mouth every 6 (six) hours as needed. 02/14/18   Victorino Dike, FNP    Allergies Naproxen sodium  Family History  Family history unknown: Yes    Social History Social History   Tobacco Use  . Smoking status: Former Smoker    Years: 2.00    Types: Cigarettes  . Smokeless tobacco: Never Used  . Tobacco comment: 1-2 cigarettes a day  Substance Use Topics  . Alcohol use: No    Alcohol/week: 0.0 standard drinks  . Drug use: No    Review of Systems Constitutional: No fever/chills Eyes: No visual changes. ENT: No sore throat. No stiff neck no neck pain Cardiovascular: Denies chest pain. Respiratory: Denies shortness of breath. Gastrointestinal:   See HPI Genitourinary: Negative for dysuria. Musculoskeletal: Negative lower extremity swelling Skin: Negative for rash. Neurological: Negative for severe headaches, focal  weakness or numbness.   ____________________________________________   PHYSICAL EXAM:  VITAL SIGNS: ED Triage Vitals  Enc Vitals Group     BP 04/11/18 1400 (!) 145/93     Pulse Rate 04/11/18 1400 87     Resp 04/11/18 1400 18     Temp 04/11/18 1400 98.7 F (37.1 C)     Temp Source 04/11/18 1400 Oral     SpO2 04/11/18 1400 96 %     Weight 04/11/18 1403 160 lb (72.6 kg)     Height 04/11/18 1403 5\' 2"  (1.575 m)     Head Circumference --      Peak Flow --      Pain Score 04/11/18  1403 0     Pain Loc --      Pain Edu? --      Excl. in [place]? --     Constitutional: Alert and oriented. Well appearing and in no acute distress. Eyes: Conjunctivae are normal Head: Atraumatic HEENT: No congestion/rhinnorhea. Mucous membranes are moist.  Oropharynx non-erythematous Neck:   Nontender with no meningismus, no masses, no stridor Cardiovascular: Normal rate, regular rhythm. Grossly normal heart sounds.  Good peripheral circulation. Respiratory: Normal respiratory effort.  No retractions. Lungs CTAB. Abdominal: Soft and nontender. No distention. No guarding no rebound Back:  There is no focal tenderness or step off.  there is no midline tenderness there are no lesions noted. there is no CVA tenderness Musculoskeletal: No lower extremity tenderness, no upper extremity tenderness. No joint effusions, no DVT signs strong distal pulses no edema Neurologic: Cranial nerves II through XII are grossly intact 5 out of 5 strength bilateral upper and lower extremity. Finger to nose within normal limits heel to shin within normal limits, speech is normal with no word finding difficulty or dysarthria, reflexes symmetric, pupils are equally round and reactive to light, there is no pronator drift, sensation is normal, vision is intact to confrontation, gait is deferred, there is no nystagmus, normal neurologic exam Skin:  Skin is warm, dry and intact. No rash noted. Psychiatric: Mood and affect are what anxious. Speech and behavior are normal.  ____________________________________________   LABS (all labs ordered are listed, but only abnormal results are displayed)  Labs Reviewed  BASIC METABOLIC PANEL - Abnormal; Notable for the following components:      Result Value   Glucose, Bld 121 (*)    All other components within normal limits  CBC - Abnormal; Notable for the following components:   MCHC 36.4 (*)    All other components within normal limits  URINALYSIS, COMPLETE (UACMP) WITH  MICROSCOPIC - Abnormal; Notable for the following components:   Color, Urine YELLOW (*)    APPearance HAZY (*)    Leukocytes, UA LARGE (*)    Bacteria, UA RARE (*)    All other components within normal limits  CBG MONITORING, ED    Pertinent labs  results that were available during my care of the patient were reviewed by me and considered in my medical decision making (see chart for details). ____________________________________________  EKG  I personally interpreted any EKGs ordered by me or triage Normal sinus rhythm, rate 77 bpm, nonspecific ST changes, borderline right bundle branch block, no acute ischemia.  No change from prior. ____________________________________________  RADIOLOGY  Pertinent labs & imaging results that were available during my care of the patient were reviewed by me and considered in my medical decision making (see chart for details). If possible, patient and/or family made aware of  any abnormal findings.  No results found. ____________________________________________    PROCEDURES  Procedure(s) performed: None  Procedures  Critical Care performed: None  ____________________________________________   INITIAL IMPRESSION / ASSESSMENT AND PLAN / ED COURSE  Pertinent labs & imaging results that were available during my care of the patient were reviewed by me and considered in my medical decision making (see chart for details).  Patient here with nausea vomiting diarrhea and feeling lightheaded, she has a normal neurologic exam.  Patient has no evidence of significant dehydrated but she does feel lightheaded when she walks around.  Nothing to suggest that this is a CVA, posterior circulation, or otherwise.  We will give the patient IV fluids, vital signs are reassuring, will give her antiemetics although she is not vomiting.  Patient is preoccupied by the remote control for the TV set which we will try to find her.  Otherwise, no acute pathology noted.   Abdomen is completely benign.  Blood work is reassuring.  Question of a small UTI but given diarrhea and vomiting I will hold off on antibiotics pending culture.  ----------------------------------------- 4:03 PM on 04/11/2018 -----------------------------------------  Patient is in no acute distress, she is watching TV with her legs crossed feeling very comfortable.  Has not vomited.  Abdomen remains benign.  Neurologic exam remains benign.  We will hopefully we will get her home.  She states she feels much better after IV fluid.  She does have a slightly positional IV, I have repositioned it and it is flowing freely to now    ____________________________________________   FINAL CLINICAL IMPRESSION(S) / ED DIAGNOSES  Final diagnoses:  None      This chart was dictated using voice recognition software.  Despite best efforts to proofread,  errors can occur which can change meaning.      Schuyler Amor, MD 04/11/18 1514    Schuyler Amor, MD 04/11/18 [phone]

## 2018-04-11 NOTE — ED Triage Notes (Signed)
Patient presents to the ED with dizziness and vomiting.  Patient reports vomiting approx. 6-7 times this morning.  Patient states she has had these symptoms previously due to "blood sugar".  Patient states she does not have diabetes and doesn't take medicine for her blood sugar.  Patient mumbling slightly in triage and giving very vague answers.

## 2018-04-11 NOTE — Discharge Instructions (Signed)
Drink plenty of fluids return to the emergency room if you have significant abdominal pain, lightheadedness if you pass out, vomiting blood, vomiting despite medication, fever or you feel worse in any way

## 2018-04-12 LAB — URINE CULTURE: Culture: 10000 — AB

## 2021-06-15 ENCOUNTER — Encounter: Payer: Self-pay | Admitting: Emergency Medicine

## 2021-06-15 ENCOUNTER — Other Ambulatory Visit: Payer: Self-pay

## 2021-06-15 ENCOUNTER — Emergency Department
Admission: EM | Admit: 2021-06-15 | Discharge: 2021-06-15 | Disposition: A | Discharge status: Home / Self Care | Payer: Self-pay | Attending: Emergency Medicine | Admitting: Emergency Medicine

## 2021-06-15 DIAGNOSIS — B958 Unspecified staphylococcus as the cause of diseases classified elsewhere: Secondary | ICD-10-CM | POA: Insufficient documentation

## 2021-06-15 DIAGNOSIS — Z87891 Personal history of nicotine dependence: Secondary | ICD-10-CM | POA: Insufficient documentation

## 2021-06-15 DIAGNOSIS — L0889 Other specified local infections of the skin and subcutaneous tissue: Secondary | ICD-10-CM | POA: Insufficient documentation

## 2021-06-15 MED ORDER — MUPIROCIN CALCIUM 2 % EX CREA: 1.0000 "application " | TOPICAL_CREAM | Freq: Two times a day (BID) | CUTANEOUS | 0 refills | Status: DC

## 2021-06-15 MED ORDER — MUPIROCIN CALCIUM 2 % EX CREA: 1.0000 "application " | TOPICAL_CREAM | Freq: Two times a day (BID) | CUTANEOUS | 0 refills | Status: AC

## 2021-06-15 MED ORDER — CEPHALEXIN 500 MG PO CAPS: 500.0000 mg | ORAL_CAPSULE | Freq: Three times a day (TID) | ORAL | 0 refills | Status: DC

## 2021-06-15 MED ORDER — CEPHALEXIN 500 MG PO CAPS: 500.0000 mg | ORAL_CAPSULE | Freq: Three times a day (TID) | ORAL | 0 refills | Status: AC

## 2021-06-15 NOTE — ED Triage Notes (Signed)
Pt reports a rash to her left abd. Pt unsure of how long it has been there. Pt reports does not itch, hurt or burn.

## 2021-06-15 NOTE — Discharge Instructions (Signed)
Apply Mupirocin twice daily for seven days.  Take Keflex three times daily for seven days.

## 2021-06-15 NOTE — ED Provider Notes (Signed)
[facility]-EMERGENCY DEPARTMENT  ____________________________________________  Time seen: Approximately 7:29 PM  I have reviewed the triage vital signs and the nursing notes.   HISTORY  Chief Complaint Rash   Historian Patient     HPI Sonya Perry is a 55 y.o. female presents to the emergency department with an erythematous, crusting rash of the abdomen.  Rash is not dermatomal in nature and there was no prodrome of pain.  No fever or chills.  No similar rash in the past.  No known tick bites or contact with outdoor foliage.   Past Medical History:  Diagnosis Date   Breast cancer ([place]) 2011   LT BREAST LUMPECTOMY   Dog bite of face 04-04-15   Obesity, unspecified 2013   Personal history of chemotherapy 2011   BREAST CA   Personal history of radiation therapy 2011   BREAST CA   Personal history of tobacco use, presenting hazards to health 2013     Immunizations up to date:  Yes.     Past Medical History:  Diagnosis Date   Breast cancer ([place]) 2011   LT BREAST LUMPECTOMY   Dog bite of face 04-04-15   Obesity, unspecified 2013   Personal history of chemotherapy 2011   BREAST CA   Personal history of radiation therapy 2011   BREAST CA   Personal history of tobacco use, presenting hazards to health 2013    Patient Active Problem List   Diagnosis Date Noted   History of breast cancer 05/02/2013   Primary cancer of upper outer quadrant of left female breast [facility])     Past Surgical History:  Procedure Laterality Date   BREAST EXCISIONAL BIOPSY Left 2011   positive   BREAST LUMPECTOMY Left 2011   BREAST CA   BREAST SURGERY Left 2011   lumpectomy radiation and chemo.   [place]   PORTACATH PLACEMENT  2011    Prior to Admission medications   Medication Sig Start Date End Date Taking? Authorizing Provider  cephALEXin (KEFLEX) 500 MG capsule Take 1 capsule (500 mg total) by mouth 3 (three) times daily for 7 days. 06/15/21 06/22/21 Yes  Vallarie Mare M, PA-C  mupirocin cream (BACTROBAN) 2 % Apply 1 application topically 2 (two) times daily for 7 days. Apply twice daily for seven days. 06/15/21 06/22/21 Yes Vallarie Mare M, PA-C  baclofen (LIORESAL) 10 MG tablet Take 1 tablet (10 mg total) by mouth 3 (three) times daily. 02/14/18   Triplett, Johnette Abraham B, FNP  ibuprofen (ADVIL,MOTRIN) 200 MG tablet Take 200 mg by mouth every 6 (six) hours as needed for mild pain or moderate pain.    [provider]  ondansetron (ZOFRAN ODT) 4 MG disintegrating tablet Take 1 tablet (4 mg total) by mouth every 8 (eight) hours as needed for nausea or vomiting. 07/26/17   Earleen Newport, MD  promethazine (PHENERGAN) 25 MG suppository Place 1 suppository (25 mg total) rectally every 6 (six) hours as needed for nausea. 04/11/18 04/11/19  Schuyler Amor, MD  sulfamethoxazole-trimethoprim (BACTRIM DS) 800-160 MG tablet Take 1 tablet by mouth 2 (two) times daily. 07/26/17   Earleen Newport, MD  traMADol (ULTRAM) 50 MG tablet Take 1 tablet (50 mg total) by mouth every 6 (six) hours as needed. 02/14/18   Victorino Dike, FNP    Allergies Naproxen sodium  Family History  Family history unknown: Yes    Social History Social History   Tobacco Use   Smoking status: Former  Years: 2.00    Types: Cigarettes   Smokeless tobacco: Never   Tobacco comments:    1-2 cigarettes a day  Substance Use Topics   Alcohol use: No    Alcohol/week: 0.0 standard drinks   Drug use: No     Review of Systems  Constitutional: No fever/chills Eyes:  No discharge ENT: No upper respiratory complaints. Respiratory: no cough. No SOB/ use of accessory muscles to breath Gastrointestinal:   No nausea, no vomiting.  No diarrhea.  No constipation. Musculoskeletal: Negative for musculoskeletal pain. Skin: Patient has rash.     ____________________________________________   PHYSICAL EXAM:  VITAL SIGNS: ED Triage Vitals  Enc Vitals Group     BP  06/15/21 1730 (!) 164/95     Pulse Rate 06/15/21 1730 91     Resp 06/15/21 1730 20     Temp 06/15/21 1730 98.2 F (36.8 C)     Temp Source 06/15/21 1730 Oral     SpO2 06/15/21 1730 97 %     Weight 06/15/21 1728 165 lb 5.5 oz (75 kg)     Height 06/15/21 1728 5\' 2"  (1.575 m)     Head Circumference --      Peak Flow --      Pain Score 06/15/21 1728 0     Pain Loc --      Pain Edu? --      Excl. in [place]? --      Constitutional: Alert and oriented. Well appearing and in no acute distress. Eyes: Conjunctivae are normal. PERRL. EOMI. Head: Atraumatic. ENT: Cardiovascular: Normal rate, regular rhythm. Normal S1 and S2.  Good peripheral circulation. Respiratory: Normal respiratory effort without tachypnea or retractions. Lungs CTAB. Good air entry to the bases with no decreased or absent breath sounds Gastrointestinal: Bowel sounds x 4 quadrants. Soft and nontender to palpation. No guarding or rigidity. No distention. Musculoskeletal: Full range of motion to all extremities. No obvious deformities noted Neurologic:  Normal for age. No gross focal neurologic deficits are appreciated.  Skin: Patient has crusting, erythematous rash of abdomen with signs of excoriation. Psychiatric: Mood and affect are normal for age. Speech and behavior are normal.   ____________________________________________   LABS (all labs ordered are listed, but only abnormal results are displayed)  Labs Reviewed - No data to display ____________________________________________  EKG   ____________________________________________  RADIOLOGY   No results found.  ____________________________________________    PROCEDURES  Procedure(s) performed:     Procedures     Medications - No data to display   ____________________________________________   INITIAL IMPRESSION / ASSESSMENT AND PLAN / ED COURSE  Pertinent labs & imaging results that were available during my care of the patient were  reviewed by me and considered in my medical decision making (see chart for details).      Assessment and plan Staph infection 55 year old female presents to the emergency department with an erythematous rash on abdomen concerning for staph infection.  We will treat patient with Keflex 3 times daily for the next 7 days and topical mupirocin.  Return precautions were given to return with new or worsening symptoms.     ____________________________________________  FINAL CLINICAL IMPRESSION(S) / ED DIAGNOSES  Final diagnoses:  Staph infection      NEW MEDICATIONS STARTED DURING THIS VISIT:  ED Discharge Orders          Ordered    cephALEXin (KEFLEX) 500 MG capsule  3 times daily        06/15/21  1925    mupirocin cream (BACTROBAN) 2 %  2 times daily        06/15/21 1925                This chart was dictated using voice recognition software/Dragon. Despite best efforts to proofread, errors can occur which can change the meaning. Any change was purely unintentional.     Lannie Fields, PA-C 06/15/21 1931    Naaman Plummer, MD 06/15/21 1945

## 2021-09-17 ENCOUNTER — Encounter: Payer: Self-pay | Admitting: *Deleted

## 2021-09-17 ENCOUNTER — Emergency Department
Admission: EM | Admit: 2021-09-17 | Discharge: 2021-09-17 | Disposition: A | Discharge status: Home / Self Care | Payer: Medicaid Other | Attending: Emergency Medicine | Admitting: Emergency Medicine

## 2021-09-17 ENCOUNTER — Other Ambulatory Visit: Payer: Self-pay

## 2021-09-17 DIAGNOSIS — R21 Rash and other nonspecific skin eruption: Secondary | ICD-10-CM | POA: Insufficient documentation

## 2021-09-17 MED ORDER — HYDROCORTISONE 2.5 % EX OINT: TOPICAL_OINTMENT | Freq: Two times a day (BID) | CUTANEOUS | 0 refills | Status: DC

## 2021-09-17 NOTE — ED Triage Notes (Signed)
Pt has a rash on abdomen.  Sx for 1 day.  No pain.  No itching  pt alert  speech clear

## 2021-09-17 NOTE — ED Provider Notes (Signed)
[facility] Provider Note    Event Date/Time   First MD Initiated Contact with Patient 09/17/21 1934     (approximate)   History   Rash   HPI  Sonya Perry is a 56 y.o. female presenting to the emergency department for treatment and evaluation of a rash on her abdomen.  She noticed the symptoms yesterday.  She denies itching or pain.  She believes that this is related to shingles.  No alleviating measures attempted prior to arrival.     Physical Exam   Triage Vital Signs: ED Triage Vitals  Enc Vitals Group     BP 09/17/21 1837 (!) 143/94     Pulse Rate 09/17/21 1837 89     Resp 09/17/21 1837 18     Temp 09/17/21 1837 99 F (37.2 C)     Temp Source 09/17/21 1837 Oral     SpO2 09/17/21 1837 97 %     Weight 09/17/21 1835 140 lb (63.5 kg)     Height 09/17/21 1835 5\' 2"  (1.575 m)     Head Circumference --      Peak Flow --      Pain Score 09/17/21 1835 0     Pain Loc --      Pain Edu? --      Excl. in [place]? --     Most recent vital signs: Vitals:   09/17/21 1837  BP: (!) 143/94  Pulse: 89  Resp: 18  Temp: 99 F (37.2 C)  SpO2: 97%     General: Awake, no distress.  CV:  Good peripheral perfusion.  Resp:  Normal effort.  Abd:  No distention.  Other:  Maculopapular, scabbed, patchy rash on the right and left side of the abdomen.  No vesicles identified.  Lesions are pinpoint and linear.   ED Results / Procedures / Treatments   Labs (all labs ordered are listed, but only abnormal results are displayed) Labs Reviewed - No data to display   EKG  Not indicated   RADIOLOGY Not indicated   PROCEDURES:  Critical Care performed: No  Procedures   MEDICATIONS ORDERED IN ED: Medications - No data to display   IMPRESSION / MDM / [place] / ED COURSE  I reviewed the triage vital signs and the nursing notes.                              Differential diagnosis includes, but is not limited to, dermatitis,  scabies, shingles  56 year old female presenting to the emergency department for evaluation of rash that was noted on her abdomen yesterday.  On exam, the rash appears to have been there for several days.  Appearance is not consistent with shingles.  It appears more consistent with scabbed scabies, however she denies itching and there is no rash on her hands or over the rest of her body.  Plan will be to have her apply some hydrocortisone cream couple times a day for the next few days.  She is to follow-up with primary care or return to the emergency department for symptoms of change, worsen, or for new concerns.      FINAL CLINICAL IMPRESSION(S) / ED DIAGNOSES   Final diagnoses:  Rash and nonspecific skin eruption     Rx / DC Orders   ED Discharge Orders          Ordered    hydrocortisone 2.5 %  ointment  2 times daily        09/17/21 2003             Note:  This document was prepared using Dragon voice recognition software and may include unintentional dictation errors.   Victorino Dike, FNP 09/17/21 2238    Lucrezia Starch, MD 09/17/21 2312

## 2021-09-17 NOTE — ED Notes (Signed)
E-signature pad unavailable - Pt verbalized understanding of D/C information - no additional concerns at this time.  

## 2021-09-17 NOTE — Discharge Instructions (Signed)
Please follow up with primary care if symptoms are not improving over the week.  Return to the ER for symptoms that change or worsen if unable to schedule an appointment.

## 2021-10-14 ENCOUNTER — Emergency Department
Admission: EM | Admit: 2021-10-14 | Discharge: 2021-10-14 | Disposition: A | Discharge status: Home / Self Care | Payer: Medicaid Other | Attending: Emergency Medicine | Admitting: Emergency Medicine

## 2021-10-14 ENCOUNTER — Other Ambulatory Visit: Payer: Self-pay

## 2021-10-14 DIAGNOSIS — R21 Rash and other nonspecific skin eruption: Secondary | ICD-10-CM

## 2021-10-14 DIAGNOSIS — Z853 Personal history of malignant neoplasm of breast: Secondary | ICD-10-CM | POA: Insufficient documentation

## 2021-10-14 MED ORDER — PREDNISONE 50 MG PO TABS: 50.0000 mg | ORAL_TABLET | Freq: Every day | ORAL | 0 refills | Status: AC

## 2021-10-14 MED ORDER — DIPHENHYDRAMINE HCL 50 MG PO TABS: 50.0000 mg | ORAL_TABLET | Freq: Three times a day (TID) | ORAL | 0 refills | Status: DC | PRN

## 2021-10-14 NOTE — ED Triage Notes (Signed)
Pt comes with c/o rash to abdomen. Pt states rash for few days.

## 2021-10-14 NOTE — ED Provider Triage Note (Signed)
Emergency Medicine Provider Triage Evaluation Note  KEYARAH MCROY , a 56 y.o. female  was evaluated in triage.  Pt complains of pruritic rash to her abdomen and right arm x1 day.  She thinks it may be shingles but denies any burning or severe pain.  Rashes on the left and right side of her abdomen and then her right forearm.  No other rash throughout her body.  She denies any new foods, medications, soaps, detergents.  No cold symptoms.  Review of Systems  Positive: Rash Negative: Fever, chills  Physical Exam  BP (!) 144/91 (BP Location: Left Arm)    Pulse 92    Temp 98.9 F (37.2 C) (Oral)    Resp 18    SpO2 93%  Gen:   Awake, no distress   Resp:  Normal effort  MSK:   Moves extremities without difficulty  Other:    Medical Decision Making  Medically screening exam initiated at 6:09 PM.  Appropriate orders placed.  Alvan Dame was informed that the remainder of the evaluation will be completed by another provider, this initial triage assessment does not replace that evaluation, and the importance of remaining in the ED until their evaluation is complete.     Duanne Guess, [place] 10/14/21 1810

## 2021-10-14 NOTE — ED Provider Notes (Signed)
[facility] Provider Note    Event Date/Time   First MD Initiated Contact with Patient 10/14/21 1843     (approximate)   History   Chief Complaint Rash   HPI Sonya Perry is a 56 y.o. female, history of breast cancer, presents to the emergency department for evaluation of rash.  Patient states that she noticed a rash on her abdomen and right arm a few days ago.  She states that she believes it is shingles.  Reports mild itching.  No pain, bleeding, or discharge.  Denies fever/chills, chest pain, shortness of breath, nausea/vomiting, weakness/dizziness, urinary symptoms, or abdominal pain.  Denies any recent new medications or new foods.  Denies any environmental exposures.  Denies any new detergents or dyes.  Per records review, patient has been seen here twice over the past 4 months for the same symptoms.  She was initially treated as staph infection with cephalexin and mupirocin on 06/15/2021.  On her second visit on 09/17/2021, she was treated as possible scabies with hydrocortisone.  Patient states that she cannot recall any of these interactions or whether or not these treatments were effective.  History Limitations: Poor historian      Physical Exam  Triage Vital Signs: ED Triage Vitals  Enc Vitals Group     BP 10/14/21 1808 (!) 144/91     Pulse Rate 10/14/21 1808 92     Resp 10/14/21 1808 18     Temp 10/14/21 1808 98.9 F (37.2 C)     Temp Source 10/14/21 1808 Oral     SpO2 10/14/21 1808 93 %     Weight --      Height --      Head Circumference --      Peak Flow --      Pain Score 10/14/21 1812 3     Pain Loc --      Pain Edu? --      Excl. in [place]? --     Most recent vital signs: Vitals:   10/14/21 1808  BP: (!) 144/91  Pulse: 92  Resp: 18  Temp: 98.9 F (37.2 C)  SpO2: 93%    General: Awake, NAD.  CV: Good peripheral perfusion.  Resp: Normal effort.  Abd: Soft, non-tender. No distention.  Neuro: At baseline. No  gross neurological deficits. Other: Crusty, maculopapular patchy rash across the abdomen with extension into the right upper extremity.  No vesicles or bullae.  No active bleeding or discharge.  Some lesions appear linear.  Physical Exam    ED Results / Procedures / Treatments  Labs (all labs ordered are listed, but only abnormal results are displayed) Labs Reviewed - No data to display   EKG Not applicable   RADIOLOGY  ED Provider Interpretation: Not applicable.  No results found.  PROCEDURES:  Critical Care performed: Not applicable  Procedures    MEDICATIONS ORDERED IN ED: Medications - No data to display   IMPRESSION / MDM / [place] / ED COURSE  I reviewed the triage vital signs and the nursing notes.                              Sonya Perry is a 56 y.o. female, history of breast cancer, presents to the emergency department for evaluation of rash.  Patient states that she noticed a rash on her abdomen and right arm a few days ago.  She states  that she believes it is shingles.  Reports mild itching.  No pain, bleeding, or discharge.    Differential diagnosis includes, but is not limited to, contact dermatitis, staph infection, scabies, fungal skin infection  ED Course Patient appears well.  Vital signs within normal limits.  NAD.  Assessment/Plan Given the patient's history and physical exam, I do not suspect any serious or life-threatening pathology.  Patient has been evaluated and treated for these symptoms several times over the past 4 months.  Patient states that she does not recall any of these encounters and is unsure whether or not the previous treatments worked.  I suspect that the patient may have some underlying mental health issue as well complicating the history.  Nonetheless, I suspect that the rash is likely more inflammatory than infectious.  I do not suspect any serious or life-threatening pathology. We will plan to discharge  this patient with prescription for prednisone and diphenhydramine.  We will also provide her with a referral for dermatology.   Patient was provided with anticipatory guidance, return precautions, and educational material. Encouraged the patient to return to the emergency department at any time if they begin to experience any new or worsening symptoms.       FINAL CLINICAL IMPRESSION(S) / ED DIAGNOSES   Final diagnoses:  Rash     Rx / DC Orders   ED Discharge Orders          Ordered    predniSONE (DELTASONE) 50 MG tablet  Daily with breakfast        10/14/21 1929    diphenhydrAMINE (BENADRYL) 50 MG tablet  Every 8 hours PRN        10/14/21 1931             Note:  This document was prepared using Dragon voice recognition software and may include unintentional dictation errors.   Teodoro Spray, [place] 10/14/21 2305    Nena Polio, MD 10/14/21 [phone]

## 2021-10-14 NOTE — Discharge Instructions (Addendum)
-  Take your medication as prescribed. -Follow-up with the dermatologist listed above. -Return to the emergency department anytime if you begin to experience any new or worsening symptoms

## 2021-12-15 ENCOUNTER — Other Ambulatory Visit: Payer: Self-pay

## 2021-12-15 ENCOUNTER — Emergency Department
Admission: EM | Admit: 2021-12-15 | Discharge: 2021-12-15 | Disposition: A | Discharge status: Home / Self Care | Payer: Medicaid Other | Attending: Emergency Medicine | Admitting: Emergency Medicine

## 2021-12-15 DIAGNOSIS — K029 Dental caries, unspecified: Secondary | ICD-10-CM | POA: Insufficient documentation

## 2021-12-15 MED ORDER — TRAMADOL HCL 50 MG PO TABS: 50.0000 mg | ORAL_TABLET | Freq: Four times a day (QID) | ORAL | 0 refills | Status: AC | PRN

## 2021-12-15 MED ORDER — MELOXICAM 15 MG PO TABS: 15.0000 mg | ORAL_TABLET | Freq: Every day | ORAL | 0 refills | Status: AC

## 2021-12-15 MED ORDER — AMOXICILLIN 500 MG PO CAPS: 500.0000 mg | ORAL_CAPSULE | Freq: Three times a day (TID) | ORAL | 0 refills | Status: AC

## 2021-12-15 MED ORDER — AMOXICILLIN 500 MG PO CAPS: 500.0000 mg | ORAL_CAPSULE | Freq: Three times a day (TID) | ORAL | 0 refills | Status: DC

## 2021-12-15 NOTE — ED Triage Notes (Signed)
Pt to ED via POV from home. Pt reports right sided dental pain since Friday. Pt denies injury to tooth. Pt tearful in triage.   ?

## 2021-12-15 NOTE — Discharge Instructions (Addendum)
OPTIONS FOR DENTAL FOLLOW UP CARE ° °[place] [facility] and Human Services - Local Safety Net Dental Clinics °http://www.ncdhhs.gov/dph/oralhealth/services/safetynetclinics.htm °  °[facility] (336-562-3123) ° °[facility] [place] (919-933-9087) ° °[facility] ([phone] ext [phone]) ° °[facility] ([phone]) ° °[facility] ([phone]) °This clinic caters to the indigent population and is on a lottery system. °Location: °[facility], [address], [place] °Clinic Hours: °Wednesdays from 6pm - 9pm, patients seen by a lottery system. °For dates, call or go to [email or web address].[facility].edu/[facility]/patients/Dental-[facility] °Services: °Cleanings, fillings and simple extractions. °Payment Options: °DENTAL WORK IS FREE OF CHARGE. Bring proof of income or support. °Best way to get seen: °Arrive at 5:15 pm - this is a lottery, NOT first come/first serve, so arriving earlier will not increase your chances of being seen. °  °  °[facility] °[phone] °Select option 1 for emergencies °  °Location: °[facility], [address], [place] °Clinic Hours: °No walk-ins accepted - call the day before to schedule an appointment. °Check in times are 9:30 am and 1:30 pm. °Services: °Simple extractions, temporary fillings, pulpectomy/pulp debridement, uncomplicated abscess drainage. °Payment Options: °PAYMENT IS DUE AT THE TIME OF SERVICE.  Fee is usually $100-200, additional surgical procedures (e.g. abscess drainage) may be extra. °Cash, checks, Visa/MasterCard accepted.  Can file Medicaid if patient is covered for dental - patient should call case worker to check. °No discount for [facility] patients. °Best way to get seen: °MUST call the day before and get onto the schedule. Can usually be seen the next 1-2 days. No walk-ins accepted. °  °  °[facility] °[phone] °   °Location: °[facility], [address], [place] °Clinic Hours: °M, W, Th, F 8am or 1:30pm, Tues 9a or 1:30 - first come/first served. °Services: °Simple extractions, temporary fillings, uncomplicated abscess drainage.  You do not need to be an [place] resident. °Payment Options: °PAYMENT IS DUE AT THE TIME OF SERVICE. °Dental insurance, otherwise sliding scale - bring proof of income or support. °Depending on income and treatment needed, cost is usually $50-200. °Best way to get seen: °Arrive early as it is first come/first served. °  °  °[facility] °[phone] °  °Location: °[address] °Clinic Hours: °Mon-Thu 8a-5p °Services: °Most basic dental services including extractions and fillings. °Payment Options: °PAYMENT IS DUE AT THE TIME OF SERVICE. °Sliding scale, up to 50% off - bring proof if income or support. °Medicaid with dental option accepted. °Best way to get seen: °Call to schedule an appointment, can usually be seen within 2 weeks OR they will try to see walk-ins - show up at 8a or 2p (you may have to wait). °  °  °[facility] °919-245-2435 °[place] RESIDENTS ONLY °  °Location: °[facility], [address], [place], [place] [address] °Clinic Hours: By appointment only. °Monday - Thursday 8am-5pm, Friday 8am-12pm °Services: Cleanings, fillings, extractions. °Payment Options: °PAYMENT IS DUE AT THE TIME OF SERVICE. °Cash, Visa or MasterCard. Sliding scale - $30 minimum per service. °Best way to get seen: °Come in to office, complete packet and make an appointment - need proof of income °or support monies for each household member and proof of [place] residence. °Usually takes about a month to get in. °  °  °[facility] °[phone] °  °Location: °[address]   [place] °Clinic Hours: Walk-in Urgent Care Dental Services are offered Monday-Friday  mornings only. °The numbers of emergencies accepted daily is limited to the number of °providers available. °Maximum 15 - Mondays, Wednesdays & Thursdays °Maximum 10 - Tuesdays & Fridays °Services: °You do not need to be a [place] resident to be seen for a dental emergency. °Emergencies are defined as pain, swelling, abnormal bleeding, or dental trauma. Walkins will receive x-rays if needed. °NOTE: Dental cleaning is not an emergency. °Payment Options: °PAYMENT IS DUE AT THE TIME OF SERVICE. °Minimum co-pay is $40.00 for uninsured patients. °Minimum co-pay is $3.00 for Medicaid with dental coverage. °Dental Insurance is accepted and must be presented at time of visit. °Medicare does not cover dental. °Forms of payment: Cash, credit card, checks. °Best way to get seen: °If not previously registered with the clinic, walk-in dental registration begins at 7:15 am and is on a first come/first serve basis. °If previously registered with the clinic, call to make an appointment. °  °  °[facility] °[phone] °[place] RESIDENTS ONLY °  °Location: °[address], [place], [place] °Clinic Hours: °Mon-Thu 10a-2p °Services: Extractions only! °Payment Options: °FREE (donations accepted) - bring proof of income or support °Best way to get seen: °Call and schedule an appointment OR come at 8am on the 1st Monday of every month (except for holidays) when it is first come/first served. °  °  °[facility] °[phone] °  °Location: °[address], [place] °Clinic Hours: °Friday mornings °Services, Payment Options, Best way to get seen: °Call for info °

## 2021-12-15 NOTE — ED Provider Notes (Signed)
? ?[facility] ?Provider Note ? ? ? Event Date/Time  ? First MD Initiated Contact with Patient 12/15/21 1442   ?  (approximate) ? ? ?History  ? ?Dental Pain ? ? ?HPI ? ?Sonya Perry is a 56 y.o. female presents emergency department complaining of tooth pain.  Patient states pain is on the right side.  Been ongoing since Friday.  Has not seen a dentist.  But does have an appointment tomorrow.  Dates she is not getting any sleep due to the pain. ? ?  ? ? ?Physical Exam  ? ?Triage Vital Signs: ?ED Triage Vitals  ?Enc Vitals Group  ?   BP 12/15/21 1401 (!) 152/83  ?   Pulse Rate 12/15/21 1401 84  ?   Resp 12/15/21 1401 18  ?   Temp 12/15/21 1401 98.5 ?F (36.9 ?C)  ?   Temp Source 12/15/21 1401 Oral  ?   SpO2 12/15/21 1401 97 %  ?   Weight 12/15/21 1402 150 lb (68 kg)  ?   Height 12/15/21 1402 '5\' 2"'$  (1.575 m)  ?   Head Circumference --   ?   Peak Flow --   ?   Pain Score 12/15/21 1401 10  ?   Pain Loc --   ?   Pain Edu? --   ?   Excl. in [place]? --   ? ? ?Most recent vital signs: ?Vitals:  ? 12/15/21 1401  ?BP: (!) 152/83  ?Pulse: 84  ?Resp: 18  ?Temp: 98.5 ?F (36.9 ?C)  ?SpO2: 97%  ? ? ? ?General: Awake, no distress.   ?CV:  Good peripheral perfusion. regular rate and  rhythm ?Resp:  Normal effort.  ?Abd:  No distention.   ?Other:    ? ? ?ED Results / Procedures / Treatments  ? ?Labs ?(all labs ordered are listed, but only abnormal results are displayed) ?Labs Reviewed - No data to display ? ? ?EKG ? ? ? ? ?RADIOLOGY ? ? ? ? ?PROCEDURES: ? ? ?Procedures ? ? ?MEDICATIONS ORDERED IN ED: ?Medications - No data to display ? ? ?IMPRESSION / MDM / ASSESSMENT AND PLAN / ED COURSE  ?I reviewed the triage vital signs and the nursing notes. ?             ?               ? ?Differential diagnosis includes, but is not limited to, dental caries, fractured tooth, abscess ? ?Explained the findings to the patient.  The right upper molar is tender to palpation and is broken in half.  Placed on antibiotic to  prevent infection, meloxicam for pain, she only gets nausea with Aleve so do not feel that the meloxicam would endanger her.  She was also given a prescription for tramadol for severe pain.  Discharged in stable condition.  Instructed to follow-up with her dental appointment or she can follow-up with the dental clinics provided on her discharge papers ? ? ? ? ?  ? ? ?FINAL CLINICAL IMPRESSION(S) / ED DIAGNOSES  ? ?Final diagnoses:  ?Pain due to dental caries  ? ? ? ?Rx / DC Orders  ? ?ED Discharge Orders   ? ?      Ordered  ?  amoxicillin (AMOXIL) 500 MG capsule  3 times daily,   Status:  Discontinued       ? 12/15/21 1530  ?  meloxicam (MOBIC) 15 MG tablet  Daily       ?  12/15/21 1530  ?  traMADol (ULTRAM) 50 MG tablet  Every 6 hours PRN       ? 12/15/21 1530  ?  amoxicillin (AMOXIL) 500 MG capsule  3 times daily       ? 12/15/21 1531  ? ?  ?  ? ?  ? ? ? ?Note:  This document was prepared using Dragon voice recognition software and may include unintentional dictation errors. ? ?  ?Versie Starks, PA-C ?12/15/21 1536 ? ?  ?Vanessa , MD ?12/19/21 1516 ? ?
